# Patient Record
Sex: Female | Born: 1965 | Marital: Single | Smoking: Never smoker
Health system: Southern US, Community
[De-identification: ages and names within clinical notes are randomized; demographics above are authoritative.]

## PROBLEM LIST (undated history)

## (undated) DIAGNOSIS — F319 Bipolar disorder, unspecified: Secondary | ICD-10-CM

## (undated) DIAGNOSIS — B019 Varicella without complication: Secondary | ICD-10-CM

## (undated) DIAGNOSIS — G4730 Sleep apnea, unspecified: Secondary | ICD-10-CM

## (undated) DIAGNOSIS — D219 Benign neoplasm of connective and other soft tissue, unspecified: Secondary | ICD-10-CM

## (undated) DIAGNOSIS — N950 Postmenopausal bleeding: Secondary | ICD-10-CM

## (undated) DIAGNOSIS — K589 Irritable bowel syndrome without diarrhea: Secondary | ICD-10-CM

## (undated) HISTORY — PX: OTHER SURGICAL HISTORY: SHX169

## (undated) HISTORY — DX: Varicella without complication: B01.9

## (undated) HISTORY — DX: Irritable bowel syndrome, unspecified: K58.9

## (undated) HISTORY — DX: Bipolar disorder, unspecified: F31.9

---

## 2013-09-30 LAB — HM MAMMOGRAPHY: HM Mammogram: NORMAL

## 2014-03-06 ENCOUNTER — Other Ambulatory Visit: Payer: Self-pay | Admitting: Obstetrics and Gynecology

## 2014-03-06 ENCOUNTER — Other Ambulatory Visit: Payer: Self-pay | Admitting: Internal Medicine

## 2014-03-06 DIAGNOSIS — Z1231 Encounter for screening mammogram for malignant neoplasm of breast: Secondary | ICD-10-CM

## 2014-04-08 ENCOUNTER — Ambulatory Visit: Payer: Self-pay

## 2014-08-11 LAB — HM PAP SMEAR: HM Pap smear: NORMAL

## 2015-01-15 LAB — HM COLONOSCOPY: HM Colonoscopy: NEGATIVE

## 2015-01-29 ENCOUNTER — Other Ambulatory Visit: Payer: Self-pay | Admitting: Nurse Practitioner

## 2015-01-29 ENCOUNTER — Encounter: Payer: Self-pay | Admitting: Nurse Practitioner

## 2015-01-29 ENCOUNTER — Ambulatory Visit (INDEPENDENT_AMBULATORY_CARE_PROVIDER_SITE_OTHER): Payer: 59 | Admitting: Nurse Practitioner

## 2015-01-29 VITALS — BP 122/68 | HR 94 | Temp 99.2°F | Resp 14 | Ht 61.0 in | Wt 189.0 lb

## 2015-01-29 DIAGNOSIS — R42 Dizziness and giddiness: Secondary | ICD-10-CM | POA: Diagnosis not present

## 2015-01-29 DIAGNOSIS — Z7189 Other specified counseling: Secondary | ICD-10-CM | POA: Diagnosis not present

## 2015-01-29 DIAGNOSIS — Z7689 Persons encountering health services in other specified circumstances: Secondary | ICD-10-CM

## 2015-01-29 LAB — POCT URINALYSIS DIPSTICK
Bilirubin, UA: NEGATIVE
Glucose, UA: NEGATIVE
Ketones, UA: NEGATIVE
Nitrite, UA: NEGATIVE
Protein, UA: NEGATIVE
Spec Grav, UA: 1.015
Urobilinogen, UA: 0.2
pH, UA: 6

## 2015-01-29 MED ORDER — MECLIZINE HCL 25 MG PO TABS: 25.0000 mg | ORAL_TABLET | Freq: Three times a day (TID) | ORAL | Status: DC | PRN

## 2015-01-29 NOTE — Assessment & Plan Note (Addendum)
Discussed acute and chronic issues. Reviewed health maintenance measures, PFSHx, and immunizations. Obtain routine labs CMET and CBC w. Diff.   Flu Shot- Given today

## 2015-01-29 NOTE — Patient Instructions (Addendum)
Take the Meclizine at night and see if it helps with vertigo.   Benign Positional Vertigo Vertigo is the feeling that you or your surroundings are moving when they are not. Benign positional vertigo is the most common form of vertigo. The cause of this condition is not serious (is benign). This condition is triggered by certain movements and positions (is positional). This condition can be dangerous if it occurs while you are doing something that could endanger you or others, such as driving.  CAUSES In many cases, the cause of this condition is not known. It may be caused by a disturbance in an area of the inner ear that helps your brain to sense movement and balance. This disturbance can be caused by a viral infection (labyrinthitis), head injury, or repetitive motion. RISK FACTORS This condition is more likely to develop in:  Women.  People who are 33 years of age or older. SYMPTOMS Symptoms of this condition usually happen when you move your head or your eyes in different directions. Symptoms may start suddenly, and they usually last for less than a minute. Symptoms may include:  Loss of balance and falling.  Feeling like you are spinning or moving.  Feeling like your surroundings are spinning or moving.  Nausea and vomiting.  Blurred vision.  Dizziness.  Involuntary eye movement (nystagmus). Symptoms can be mild and cause only slight annoyance, or they can be severe and interfere with daily life. Episodes of benign positional vertigo may return (recur) over time, and they may be triggered by certain movements. Symptoms may improve over time. DIAGNOSIS This condition is usually diagnosed by medical history and a physical exam of the head, neck, and ears. You may be referred to a health care provider who specializes in ear, nose, and throat (ENT) problems (otolaryngologist) or a provider who specializes in disorders of the nervous system (neurologist). You may have additional  testing, including:  MRI.  A CT scan.  Eye movement tests. Your health care provider may ask you to change positions quickly while he or she watches you for symptoms of benign positional vertigo, such as nystagmus. Eye movement may be tested with an electronystagmogram (ENG), caloric stimulation, the Dix-Hallpike test, or the roll test.  An electroencephalogram (EEG). This records electrical activity in your brain.  Hearing tests. TREATMENT Usually, your health care provider will treat this by moving your head in specific positions to adjust your inner ear back to normal. Surgery may be needed in severe cases, but this is rare. In some cases, benign positional vertigo may resolve on its own in 2-4 weeks. HOME CARE INSTRUCTIONS Safety  Move slowly.Avoid sudden body or head movements.  Avoid driving.  Avoid operating heavy machinery.  Avoid doing any tasks that would be dangerous to you or others if a vertigo episode would occur.  If you have trouble walking or keeping your balance, try using a cane for stability. If you feel dizzy or unstable, sit down right away.  Return to your normal activities as told by your health care provider. Ask your health care provider what activities are safe for you. General Instructions  Take over-the-counter and prescription medicines only as told by your health care provider.  Avoid certain positions or movements as told by your health care provider.  Drink enough fluid to keep your urine clear or pale yellow.  Keep all follow-up visits as told by your health care provider. This is important. SEEK MEDICAL CARE IF:  You have a fever.  Your condition gets worse or you develop new symptoms.  Your family or friends notice any behavioral changes.  Your nausea or vomiting gets worse.  You have numbness or a "pins and needles" sensation. SEEK IMMEDIATE MEDICAL CARE IF:  You have difficulty speaking or moving.  You are always dizzy.  You  faint.  You develop severe headaches.  You have weakness in your legs or arms.  You have changes in your hearing or vision.  You develop a stiff neck.  You develop sensitivity to light.   This information is not intended to replace advice given to you by your health care provider. Make sure you discuss any questions you have with your health care provider.   Document Released: 11/15/2005 Document Revised: 10/29/2014 Document Reviewed: 06/02/2014 Elsevier Interactive Patient Education [employer].

## 2015-01-29 NOTE — Progress Notes (Signed)
Pre visit review using our clinic review tool, if applicable. No additional management support is needed unless otherwise documented below in the visit note. 

## 2015-01-29 NOTE — Assessment & Plan Note (Signed)
Probable vertigo in etiology. Symptoms reproduced- worse on right side from lying and then less when turning to the left. Will try Meclizine. We will follow up on the 14th.

## 2015-01-29 NOTE — Addendum Note (Signed)
Addended by: Rubbie Battiest on: 01/29/2015 03:04 PM   Modules accepted: Orders

## 2015-01-29 NOTE — Progress Notes (Signed)
Patient ID: Cassidy Santos, female    DOB: May 23, 1965  Age: 49 y.o. MRN: [id number]  CC: Nausea   HPI Cassidy Santos presents for New pt CC of nausea and dizziness x 2 days.   1) Yesterday pt reports she woke up with severe dizziness and clamminess. Room spinning- not happened to this extent before 4 hours of peristance, went to bed, Woke up and it was improved  Went to bed at 10 pm and at 11 pm and it happened again, every few hours happened until 8 am   Vomiting- small amount all night x 4  Loose bowel movements since colonoscopy 2 weeks of fatigue  No treatment to date Stress at work  [place] contacts- homeless and sick people   Takes probiotics IBS constipation    Bipolar Medications been on for years she reports  New pt info:  Tdap 2015, Wants Flu vaccine today Colonoscopy- 2016, clear this time  Eye exam 2014 PAP- 2016  LMP- 3 weeks ago  [facility]   Cyst on ovaries   Psychiatrist- Cassidy Santos in [place] [facility]    3 months  GI- Dr. Mora Santos Dr. Arnoldo Santos- Cassidy Santos     History Cassidy Santos has a past medical history of Chicken pox; IBS (irritable bowel syndrome); and Bipolar affective ([place]).   She has no past surgical history on file.   Her family history includes Heart disease in her father.She reports that she has never smoked. She has never used smokeless tobacco. She reports that she does not drink alcohol or use illicit drugs.  No outpatient prescriptions prior to visit.   No facility-administered medications prior to visit.    ROS Review of Systems  Constitutional: Positive for diaphoresis and fatigue. Negative for fever and chills.  HENT: Positive for congestion. Negative for ear discharge, ear pain, postnasal drip, rhinorrhea, sinus pressure, sneezing, sore throat, trouble swallowing and voice change.   Eyes: Negative for visual disturbance.  Respiratory: Negative for cough, chest tightness, shortness of  breath and wheezing.   Cardiovascular: Negative for chest pain, palpitations and leg swelling.  Gastrointestinal: Positive for nausea. Negative for vomiting, diarrhea and constipation.  Neurological: Positive for dizziness. Negative for syncope and headaches.  Psychiatric/Behavioral: Negative for suicidal ideas and sleep disturbance. The patient is not nervous/anxious.     Objective:  BP 122/68 mmHg  Pulse 94  Temp(Src) 99.2 F (37.3 C)  Resp 14  Ht _0  (1.549 m)  Wt 189 lb (85.73 kg)  BMI 35.73 kg/m2  SpO2 98%  LMP 01/08/2015  Physical Exam  Constitutional: She is oriented to person, place, and time. She appears well-developed and well-nourished. No distress.  HENT:  Head: Normocephalic and atraumatic.  Right Ear: External ear normal.  Left Ear: External ear normal.  Mouth/Throat: No oropharyngeal exudate.  Eyes: EOM are normal. Pupils are equal, round, and reactive to light. Right eye exhibits no discharge. Left eye exhibits no discharge. No scleral icterus.  Neck: Normal range of motion. Neck supple.  Cardiovascular: Normal rate, regular rhythm and normal heart sounds.  Exam reveals no gallop and no friction rub.   No murmur heard. Pulmonary/Chest: Effort normal and breath sounds normal. No respiratory distress. She has no wheezes. She has no rales. She exhibits no tenderness.  Abdominal: Soft. Bowel sounds are normal. She exhibits no distension and no mass. There is no tenderness. There is no rebound and no guarding.  Lymphadenopathy:    She has no cervical adenopathy.  Neurological: She  is alert and oriented to person, place, and time. No cranial nerve deficit. She exhibits normal muscle tone. Coordination normal.  Reproducible symptoms with lying down and turning onto right side/left side. Nystagmus horizontal and present only for a few seconds.  Skin: Skin is warm and dry. No rash noted. She is not diaphoretic.  Psychiatric: She has a normal mood and affect. Her behavior  is normal. Judgment and thought content normal.   Assessment & Plan:   Cassidy Santos was seen today for nausea.  Diagnoses and all orders for this visit:  Dizziness -     POCT Urinalysis Dipstick -     Comp Met (CMET) -     CBC w/Diff  Encounter to establish care  Other orders -     meclizine (ANTIVERT) 25 MG tablet; Take 1 tablet (25 mg total) by mouth 3 (three) times daily as needed for dizziness.   I am [name] start on meclizine. I am also having her maintain her lamoTRIgine, Oxcarbazepine, Norethindrone Acetate-Ethinyl Estradiol, and ARIPiprazole.  Meds ordered this encounter  Medications  . lamoTRIgine (LAMICTAL) 150 MG tablet    Sig: Take 1 tablet by mouth daily.  . Oxcarbazepine (TRILEPTAL) 300 MG tablet    Sig: Take 1 tablet by mouth 3 (three) times daily.  . Norethindrone Acetate-Ethinyl Estradiol (MICROGESTIN) 1.5-30 MG-MCG tablet    Sig: Take 1 tablet by mouth daily.  . ARIPiprazole (ABILIFY) 10 MG tablet    Sig: Take 10 mg by mouth 2 (two) times daily.  . meclizine (ANTIVERT) 25 MG tablet    Sig: Take 1 tablet (25 mg total) by mouth 3 (three) times daily as needed for dizziness.    Dispense:  90 tablet    Refill:  0    Order Specific Question:  Supervising Provider    Answer:  Cassidy Santos [[id number]]     Follow-up: Return if symptoms worsen or fail to improve, for Already scheduled for the 14th of Dec. .

## 2015-01-31 LAB — URINE CULTURE: Colony Count: >=100000

## 2015-02-04 ENCOUNTER — Ambulatory Visit: Payer: 59 | Admitting: Nurse Practitioner

## 2015-02-04 ENCOUNTER — Encounter: Payer: Self-pay | Admitting: Nurse Practitioner

## 2015-02-04 ENCOUNTER — Ambulatory Visit (INDEPENDENT_AMBULATORY_CARE_PROVIDER_SITE_OTHER): Payer: 59 | Admitting: Nurse Practitioner

## 2015-02-04 VITALS — BP 136/88 | HR 84 | Temp 98.9°F | Ht 61.0 in | Wt 193.0 lb

## 2015-02-04 DIAGNOSIS — H9193 Unspecified hearing loss, bilateral: Secondary | ICD-10-CM | POA: Diagnosis not present

## 2015-02-04 DIAGNOSIS — R42 Dizziness and giddiness: Secondary | ICD-10-CM | POA: Diagnosis not present

## 2015-02-04 NOTE — Progress Notes (Signed)
Patient ID: Cassidy Santos, female    DOB: 08/16/1965  Age: 49 y.o. MRN: [id number]  CC: Establish Care   HPI Cassidy Santos presents for establishing care and follow up on dizziness.   1) Follow up of dizziness   Feeling improved Meclizine is helpful for dizziness  2) Hearing concerns- Not hearing as well, no difference between R and L Become worse in the last few years  Hearing test entrance a few years ago without   History Cassidy Santos has a past medical history of Chicken pox; IBS (irritable bowel syndrome); and Bipolar affective ([place]).   She has no past surgical history on file.   Her family history includes Heart disease in her father.She reports that she has never smoked. She has never used smokeless tobacco. She reports that she does not drink alcohol or use illicit drugs.  Outpatient Prescriptions Prior to Visit  Medication Sig Dispense Refill  . lamoTRIgine (LAMICTAL) 150 MG tablet Take 1 tablet by mouth daily.    . meclizine (ANTIVERT) 25 MG tablet Take 1 tablet (25 mg total) by mouth 3 (three) times daily as needed for dizziness. 90 tablet 0  . Norethindrone Acetate-Ethinyl Estradiol (MICROGESTIN) 1.5-30 MG-MCG tablet Take 1 tablet by mouth daily.    . Oxcarbazepine (TRILEPTAL) 300 MG tablet Take 1 tablet by mouth 3 (three) times daily.    . ARIPiprazole (ABILIFY) 10 MG tablet Take 10 mg by mouth 2 (two) times daily.     No facility-administered medications prior to visit.    ROS Review of Systems  Constitutional: Negative for fever, chills, diaphoresis and fatigue.  HENT: Positive for hearing loss. Negative for tinnitus, trouble swallowing and voice change.   Neurological: Positive for dizziness.   Objective:  BP 136/88 mmHg  Pulse 84  Temp(Src) 98.9 F (37.2 C) (Oral)  Ht 5\' 1"  (1.549 m)  Wt 193 lb (87.544 kg)  BMI 36.49 kg/m2  LMP 01/08/2015  Physical Exam  Constitutional: She is oriented to person, place, and time. She appears well-developed and  well-nourished. No distress.  HENT:  Head: Normocephalic and atraumatic.  Right Ear: External ear normal.  Left Ear: External ear normal.  Cardiovascular: Normal rate and regular rhythm.   Pulmonary/Chest: Effort normal and breath sounds normal. No respiratory distress. She has no wheezes. She has no rales. She exhibits no tenderness.  Neurological: She is alert and oriented to person, place, and time.  Skin: Skin is warm and dry. No rash noted. She is not diaphoretic.  Psychiatric: She has a normal mood and affect. Her behavior is normal. Judgment and thought content normal.  Flat affect   Assessment & Plan:   Cassidy Santos was seen today for establish care.  Diagnoses and all orders for this visit:  Dizziness  Hearing loss, bilateral  I am [name] maintain her lamoTRIgine, Oxcarbazepine, Norethindrone Acetate-Ethinyl Estradiol, and meclizine.  No orders of the defined types were placed in this encounter.     Follow-up: Return if symptoms worsen or fail to improve.

## 2015-02-04 NOTE — Patient Instructions (Addendum)
August for your complete physical.   Happy Holidays!

## 2015-02-10 ENCOUNTER — Other Ambulatory Visit: Payer: Self-pay | Admitting: Nurse Practitioner

## 2015-02-10 ENCOUNTER — Telehealth: Payer: Self-pay | Admitting: Nurse Practitioner

## 2015-02-10 DIAGNOSIS — H9193 Unspecified hearing loss, bilateral: Secondary | ICD-10-CM

## 2015-02-10 DIAGNOSIS — H8113 Benign paroxysmal vertigo, bilateral: Secondary | ICD-10-CM

## 2015-02-10 MED ORDER — ARIPIPRAZOLE 10 MG PO TABS: 10.0000 mg | ORAL_TABLET | Freq: Every day | ORAL | Status: DC

## 2015-02-10 NOTE — Telephone Encounter (Signed)
Please advise 

## 2015-02-10 NOTE — Telephone Encounter (Signed)
Patient needs a refill on this medication which is a historical medication. Please advise?

## 2015-02-10 NOTE — Telephone Encounter (Signed)
NP has approved and it was sent to the pharmacy.

## 2015-02-10 NOTE — Telephone Encounter (Signed)
Sent to provider for review

## 2015-02-10 NOTE — Telephone Encounter (Signed)
Pt came into office today. She would like to know if [facility] can refill her ARIPiprazole (ABILIFY) 10 MG tablet a 30 day supply. Please send refill to [facility] on [address].  Thank you.

## 2015-02-13 DIAGNOSIS — H9190 Unspecified hearing loss, unspecified ear: Secondary | ICD-10-CM | POA: Insufficient documentation

## 2015-02-13 NOTE — Assessment & Plan Note (Addendum)
Improved with meclizine. Pt would still like to see ENT for hearing loss bilaterally and to see if she has inner ear concerns.

## 2015-02-13 NOTE — Assessment & Plan Note (Signed)
ENT referral in place for hearing concerns and vertigo. FU prn worsening/failure to improve.

## 2015-03-24 ENCOUNTER — Encounter: Payer: Self-pay | Admitting: Nurse Practitioner

## 2015-07-02 ENCOUNTER — Ambulatory Visit: Payer: 59 | Admitting: Nurse Practitioner

## 2015-07-02 ENCOUNTER — Ambulatory Visit (INDEPENDENT_AMBULATORY_CARE_PROVIDER_SITE_OTHER): Payer: 59 | Admitting: Nurse Practitioner

## 2015-07-02 ENCOUNTER — Encounter: Payer: Self-pay | Admitting: Nurse Practitioner

## 2015-07-02 VITALS — BP 112/70 | HR 93 | Temp 98.6°F | Resp 16 | Ht 61.0 in | Wt 189.0 lb

## 2015-07-02 DIAGNOSIS — M25532 Pain in left wrist: Secondary | ICD-10-CM | POA: Diagnosis not present

## 2015-07-02 NOTE — Patient Instructions (Signed)
Advil, ice, and rest Immobilizer at night Continue tumeric.   It was great working with you! Take care!

## 2015-07-02 NOTE — Assessment & Plan Note (Signed)
New onset  Conservative measures chosen by pt Tumeric, advil prn, ice, rest, immobilizer FU prn worsening/failure to improve.

## 2015-07-02 NOTE — Progress Notes (Signed)
Patient ID: Cassidy Santos, female    DOB: October 11, 1965  Age: 50 y.o. MRN: [id number]  CC: Hand Pain   HPI Cassidy Santos presents for CC of left wrist pain x 4 weeks.  Onset- 4 weeks  Location- left CMC joint of thumb Duration - constant  Characteristics- Locking up at night, sore during the day  Aggravating factors- use of phone/computer Relieving factors- rest Severity- mild- improving  Slightly improving  Trauma- denies  Treatment to date:  Immobilizer  Massage Ibuprofen- helps   History Cassidy Santos has a past medical history of Chicken pox; IBS (irritable bowel syndrome); and Bipolar affective ([place]).   She has no past surgical history on file.   Her family history includes Heart disease in her father.She reports that she has never smoked. She has never used smokeless tobacco. She reports that she does not drink alcohol or use illicit drugs.  Outpatient Prescriptions Prior to Visit  Medication Sig Dispense Refill  . ARIPiprazole (ABILIFY) 10 MG tablet Take 1 tablet (10 mg total) by mouth daily. (Patient taking differently: Take 2 mg by mouth daily. ) 30 tablet 0  . lamoTRIgine (LAMICTAL) 150 MG tablet Take 1 tablet by mouth daily.    . meclizine (ANTIVERT) 25 MG tablet Take 1 tablet (25 mg total) by mouth 3 (three) times daily as needed for dizziness. 90 tablet 0  . Norethindrone Acetate-Ethinyl Estradiol (MICROGESTIN) 1.5-30 MG-MCG tablet Take 1 tablet by mouth daily.    . Oxcarbazepine (TRILEPTAL) 300 MG tablet Take 1 tablet by mouth 3 (three) times daily. Reported on 07/02/2015     No facility-administered medications prior to visit.    ROS Review of Systems  Constitutional: Negative for fever, chills, diaphoresis and fatigue.  Respiratory: Negative for chest tightness, shortness of breath and wheezing.   Cardiovascular: Negative for chest pain, palpitations and leg swelling.  Gastrointestinal: Negative for nausea, vomiting and diarrhea.  Musculoskeletal:  Positive for joint swelling and arthralgias. Negative for myalgias, back pain, gait problem, neck pain and neck stiffness.  Skin: Negative for rash.  Neurological: Negative for dizziness, weakness, numbness and headaches.  Psychiatric/Behavioral: The patient is not nervous/anxious.     Objective:  BP 112/70 mmHg  Pulse 93  Temp(Src) 98.6 F (37 C) (Oral)  Resp 16  Ht 5\' 1"  (1.549 m)  Wt 189 lb (85.73 kg)  BMI 35.73 kg/m2  SpO2 98%  LMP 06/22/2015  Physical Exam  Constitutional: She is oriented to person, place, and time. She appears well-developed and well-nourished. No distress.  HENT:  Head: Normocephalic and atraumatic.  Right Ear: External ear normal.  Left Ear: External ear normal.  Cardiovascular: Normal rate, regular rhythm and normal heart sounds.   Pulmonary/Chest: Effort normal and breath sounds normal. No respiratory distress. She has no wheezes. She has no rales. She exhibits no tenderness.  Musculoskeletal: Normal range of motion. She exhibits tenderness. She exhibits no edema.       Hands: CMC joint tenderness to ROM against resistance, none to palpation, finkelstein was negative of left side.     Neurological: She is alert and oriented to person, place, and time. No cranial nerve deficit. She exhibits normal muscle tone. Coordination normal.  Skin: Skin is warm and dry. No rash noted. She is not diaphoretic.  Psychiatric: She has a normal mood and affect. Her behavior is normal. Judgment and thought content normal.   Assessment & Plan:   Cassidy Santos was seen today for hand pain.  Diagnoses and all orders for this visit:  Left wrist pain   I am [name] maintain her lamoTRIgine, Oxcarbazepine, Norethindrone Acetate-Ethinyl Estradiol, meclizine, and ARIPiprazole.  No orders of the defined types were placed in this encounter.     Follow-up: Return if symptoms worsen or fail to improve.

## 2016-05-11 ENCOUNTER — Other Ambulatory Visit: Payer: Self-pay | Admitting: Obstetrics and Gynecology

## 2016-05-11 DIAGNOSIS — Z01419 Encounter for gynecological examination (general) (routine) without abnormal findings: Secondary | ICD-10-CM

## 2016-08-05 DIAGNOSIS — F3178 Bipolar disorder, in full remission, most recent episode mixed: Secondary | ICD-10-CM | POA: Insufficient documentation

## 2016-08-08 DIAGNOSIS — R7303 Prediabetes: Secondary | ICD-10-CM | POA: Insufficient documentation

## 2016-10-31 ENCOUNTER — Other Ambulatory Visit: Payer: Self-pay | Admitting: Obstetrics and Gynecology

## 2016-10-31 ENCOUNTER — Ambulatory Visit
Admission: RE | Admit: 2016-10-31 | Discharge: 2016-10-31 | Disposition: A | Discharge status: Home / Self Care | Payer: BC Managed Care – PPO | Source: Ambulatory Visit | Attending: Obstetrics and Gynecology | Admitting: Obstetrics and Gynecology

## 2016-10-31 DIAGNOSIS — Z01419 Encounter for gynecological examination (general) (routine) without abnormal findings: Secondary | ICD-10-CM

## 2016-10-31 DIAGNOSIS — Z1231 Encounter for screening mammogram for malignant neoplasm of breast: Secondary | ICD-10-CM | POA: Diagnosis not present

## 2016-11-08 ENCOUNTER — Inpatient Hospital Stay
Admission: RE | Admit: 2016-11-08 | Discharge: 2016-11-08 | Disposition: A | Discharge status: Home / Self Care | Payer: Self-pay | Source: Ambulatory Visit | Attending: *Deleted | Admitting: *Deleted

## 2016-11-08 ENCOUNTER — Other Ambulatory Visit: Payer: Self-pay | Admitting: *Deleted

## 2016-11-08 DIAGNOSIS — Z9289 Personal history of other medical treatment: Secondary | ICD-10-CM

## 2018-02-06 ENCOUNTER — Other Ambulatory Visit: Payer: Self-pay | Admitting: Obstetrics and Gynecology

## 2018-02-06 DIAGNOSIS — Z1231 Encounter for screening mammogram for malignant neoplasm of breast: Secondary | ICD-10-CM

## 2018-02-22 ENCOUNTER — Encounter: Payer: Self-pay | Admitting: Radiology

## 2018-02-22 ENCOUNTER — Ambulatory Visit
Admission: RE | Admit: 2018-02-22 | Discharge: 2018-02-22 | Disposition: A | Discharge status: Home / Self Care | Payer: BLUE CROSS/BLUE SHIELD | Source: Ambulatory Visit | Attending: Obstetrics and Gynecology | Admitting: Obstetrics and Gynecology

## 2018-02-22 DIAGNOSIS — Z1231 Encounter for screening mammogram for malignant neoplasm of breast: Secondary | ICD-10-CM | POA: Diagnosis present

## 2018-03-22 DIAGNOSIS — K581 Irritable bowel syndrome with constipation: Secondary | ICD-10-CM | POA: Insufficient documentation

## 2018-11-27 DIAGNOSIS — F319 Bipolar disorder, unspecified: Secondary | ICD-10-CM | POA: Insufficient documentation

## 2019-01-01 ENCOUNTER — Other Ambulatory Visit: Payer: Self-pay | Admitting: Family Medicine

## 2019-01-01 DIAGNOSIS — Z1231 Encounter for screening mammogram for malignant neoplasm of breast: Secondary | ICD-10-CM

## 2019-01-23 IMAGING — MG DIGITAL SCREENING BILATERAL MAMMOGRAM WITH TOMO AND CAD
8 series · 8 of 24 positions shown · non-contrast
Comparison: Previous exam(s).

CLINICAL DATA: Screening.

EXAM:
DIGITAL SCREENING BILATERAL MAMMOGRAM WITH TOMO AND CAD

[L MLO synth-2D]
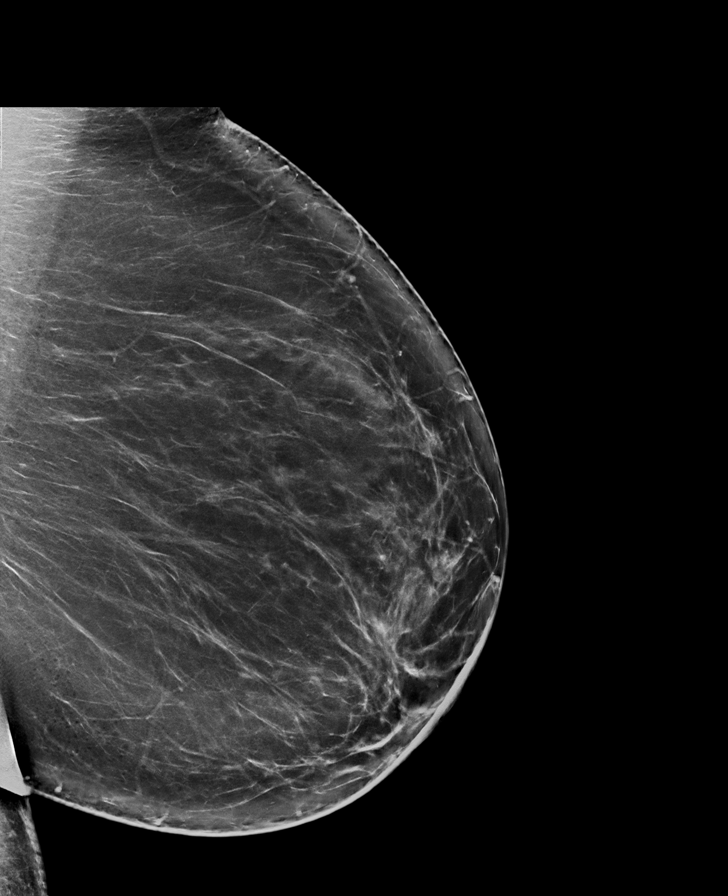

[R CC synth-2D]
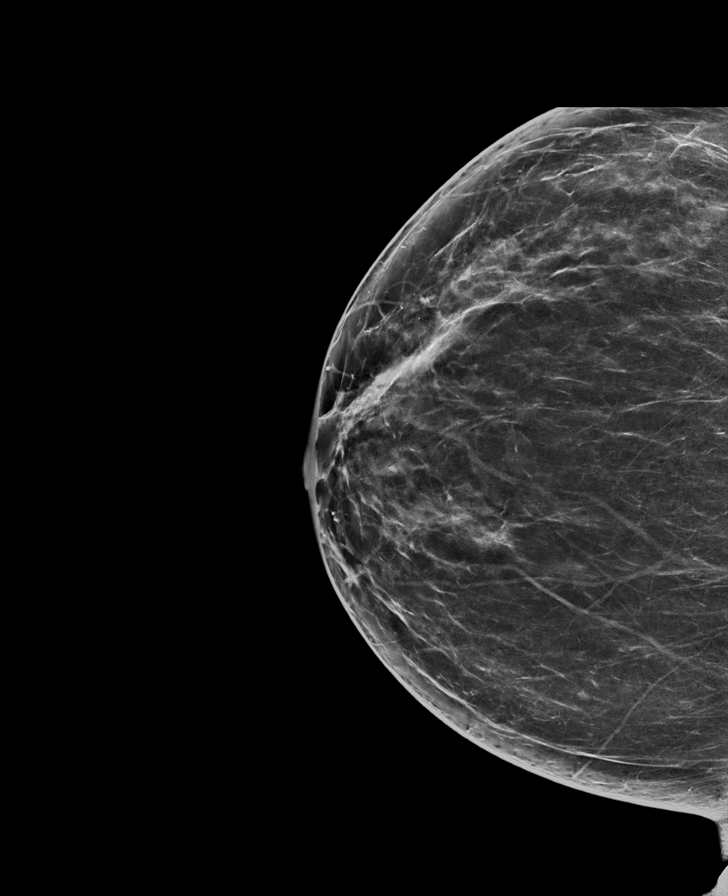

[R MLO synth-2D]
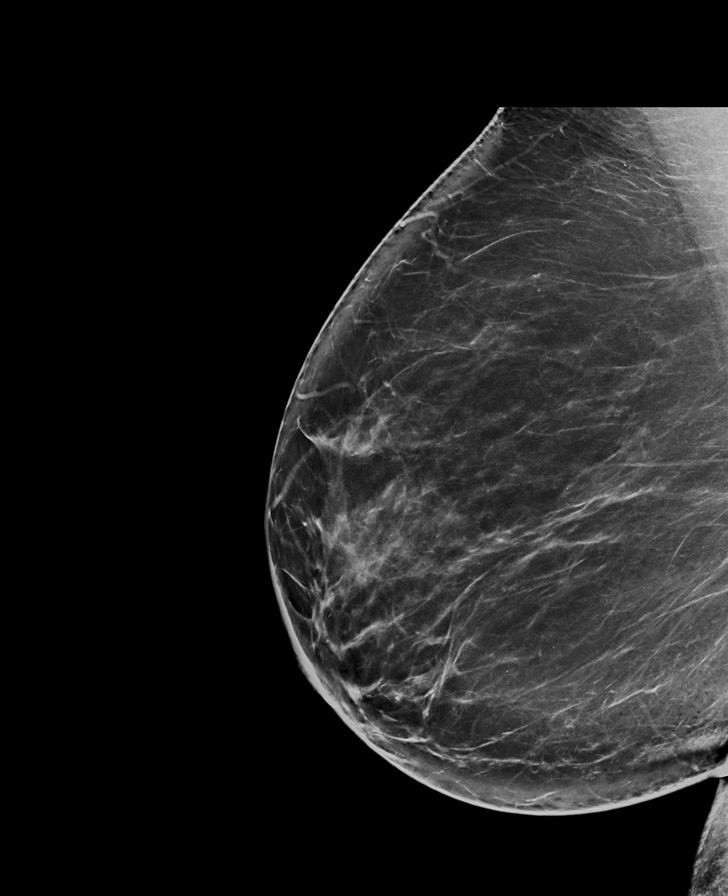

[L CC synth-2D]
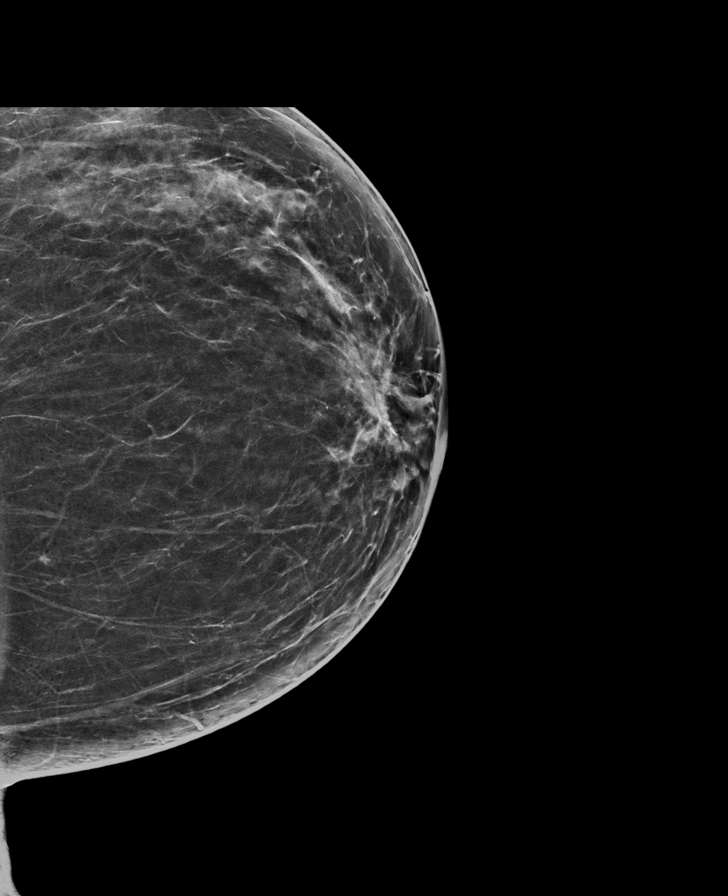

[R MLO tomo · tomo slice 47/93.0]
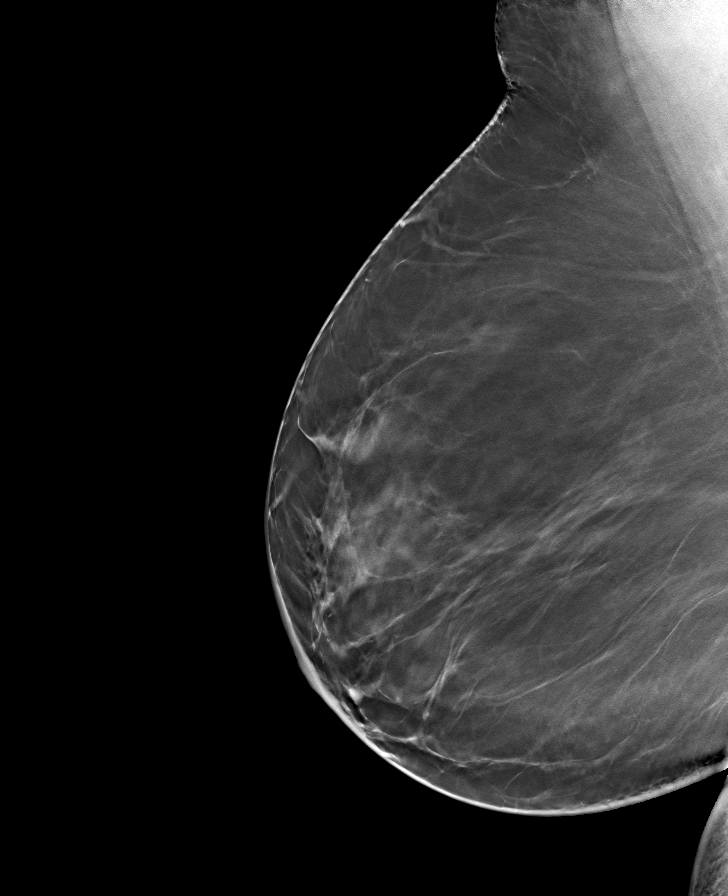

[L CC tomo · tomo slice 41/82.0]
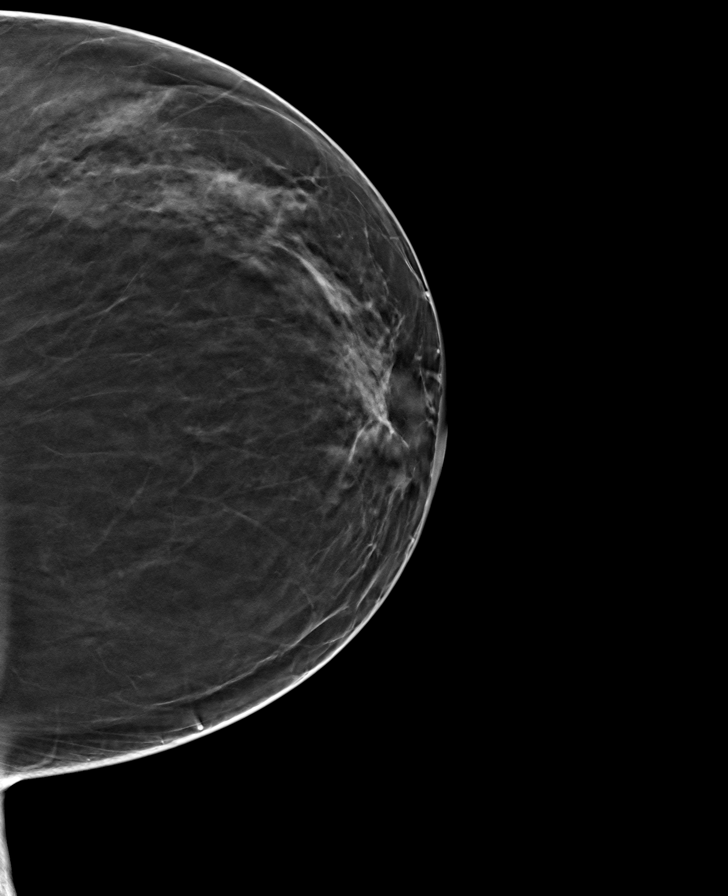

[L MLO tomo · tomo slice 49/98.0]
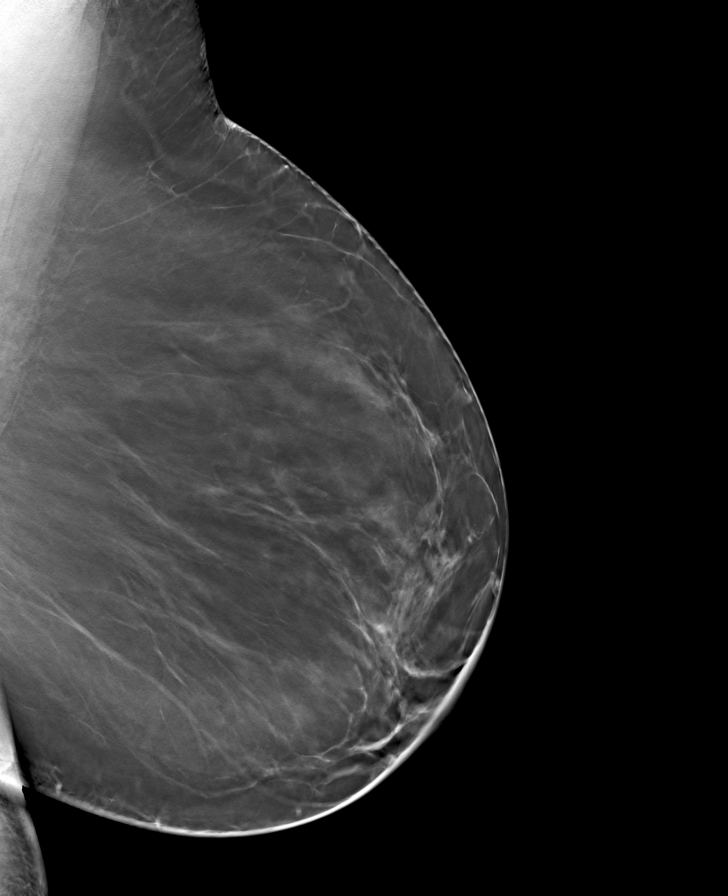

[R CC tomo · tomo slice 39/77.0]
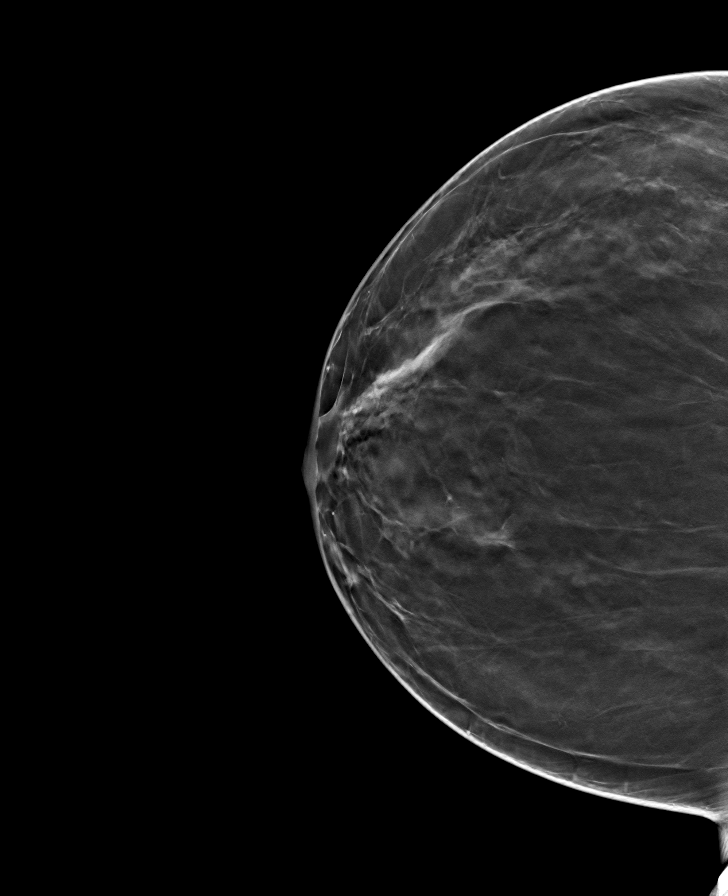

[8 of 24 positions shown; findings below may reference images not displayed]

ACR Breast Density Category b: There are scattered areas of
fibroglandular density.
FINDINGS: There are no findings suspicious for malignancy. Images were
processed with CAD.
IMPRESSION: No mammographic evidence of malignancy. A result letter of this
screening mammogram will be mailed directly to the patient.

RECOMMENDATION:
Screening mammogram in one year. (Code:[id number])

BI-RADS CATEGORY  1: Negative.

## 2019-06-17 ENCOUNTER — Ambulatory Visit
Admission: RE | Admit: 2019-06-17 | Discharge: 2019-06-17 | Disposition: A | Discharge status: Home / Self Care | Payer: BC Managed Care – PPO | Source: Ambulatory Visit | Attending: Family Medicine | Admitting: Family Medicine

## 2019-06-17 ENCOUNTER — Other Ambulatory Visit: Payer: Self-pay

## 2019-06-17 DIAGNOSIS — Z1231 Encounter for screening mammogram for malignant neoplasm of breast: Secondary | ICD-10-CM | POA: Diagnosis not present

## 2020-05-22 NOTE — Progress Notes (Signed)
[facility] [place], [place] [address]  Pulmonary Sleep Medicine   Office Visit Note  Patient Name: Cassidy Santos DOB: 1965/12/07 MRN [id number]    Chief Complaint: Obstructive Sleep Apnea visit  Brief History:  Misao is seen today for initial consultation. The patient has a 4 year history of sleep apnea.She recently received a replacement CPAP. Her old machine was on the recall.  Patient is using PAP nightly.  The patient feels somewhat more rested. after sleeping with PAP.  The patient reports benefiting from PAP use although she is still adjusting to the increase in pressure. Reported sleepiness is  Not much better due to limited use and the Epworth Sleepiness Score is 13 out of 24. The patient does not take naps but is sleepy when she drives. The patient complains of the following: throat pain.  The compliance download shows fair compliance with an average use time of 5.4 hours. The AHI is 2  The patient does not complain of limb movements disrupting sleep.  ROS  General: (-) fever, (-) chills, (-) night sweat Nose and Sinuses: (-) nasal stuffiness or itchiness, (-) postnasal drip, (-) nosebleeds, (-) sinus trouble. Mouth and Throat: (-) sore throat, (-) hoarseness. Neck: (-) swollen glands, (-) enlarged thyroid, (-) neck pain. Respiratory: - cough, - shortness of breath, - wheezing. Neurologic: - numbness, - tingling. Psychiatric: - anxiety, - depression   Current Medication: Outpatient Encounter Medications as of 05/25/2020  Medication Sig Note  . lamoTRIgine (LAMICTAL) 150 MG tablet lamotrigine 150 mg tablet   . meclizine (ANTIVERT) 12.5 MG tablet Take by mouth.   . ARIPiprazole (ABILIFY) 10 MG tablet Take 1 tablet (10 mg total) by mouth daily. (Patient taking differently: Take 2 mg by mouth daily. )   . ergocalciferol (VITAMIN D2) 1.25 MG (50000 UT) capsule Vitamin D2 1,250 mcg (50,000 unit) capsule   . Fish Oil-Vitamin D 1000-1000 MG-UNIT  CAPS Take by mouth.   . lamoTRIgine (LAMICTAL) 150 MG tablet Take 1 tablet by mouth daily. 01/29/2015: Received from: [place]:   . lubiprostone (AMITIZA) 8 MCG capsule Amitiza 8 mcg capsule   . meclizine (ANTIVERT) 25 MG tablet Take 1 tablet (25 mg total) by mouth 3 (three) times daily as needed for dizziness.   . Multiple Vitamin (MULTI-VITAMINS) TABS Take by mouth.   . [DISCONTINUED] Norethindrone Acetate-Ethinyl Estradiol (MICROGESTIN) 1.5-30 MG-MCG tablet Take 1 tablet by mouth daily. 01/29/2015: Received from: [place]:   . [DISCONTINUED] Oxcarbazepine (TRILEPTAL) 300 MG tablet Take 1 tablet by mouth 3 (three) times daily. Reported on 07/02/2015 01/29/2015: Received from: [place]:    No facility-administered encounter medications on file as of 05/25/2020.    Surgical History: History reviewed. No pertinent surgical history.  Medical History: Past Medical History:  Diagnosis Date  . Bipolar affective ([place])   . Chicken pox   . IBS (irritable bowel syndrome)     Family History: Non contributory to the present illness  Social History: Social History   Socioeconomic History  . Marital status: Single    Spouse name: Not on file  . Number of children: Not on file  . Years of education: Not on file  . Highest education level: Not on file  Occupational History  . Not on file  Tobacco Use  . Smoking status: Never Smoker  . Smokeless tobacco: Never Used  Substance and Sexual Activity  . Alcohol use: No    Alcohol/week: 0.0 standard drinks  .  Drug use: No  . Sexual activity: Yes    Partners: Male    Birth control/protection: None    Comment: 1 Partner  Other Topics Concern  . Not on file  Social History Narrative   Pt is single   Employed as a Licensed conveyancer    Has a JD degree    Social Determinants of Radio broadcast assistant Strain: Not on file  Food Insecurity: Not on file  Transportation Needs: Not on file   Physical Activity: Not on file  Stress: Not on file  Social Connections: Not on file  Intimate Partner Violence: Not on file    Vital Signs: Blood pressure 139/86, pulse 97, temperature 98.6 F (37 C), temperature source Temporal, resp. rate 14, height 4\' 11"  (1.499 m), weight 203 lb (92.1 kg), last menstrual period 10/17/2016, SpO2 97 %.  Examination: General Appearance: The patient is well-developed, well-nourished, and in no distress. Neck Circumference: 37 Skin: Gross inspection of skin unremarkable. Head: normocephalic, no gross deformities. Eyes: no gross deformities noted. ENT: ears appear grossly normal Neurologic: Alert and oriented. No involuntary movements.    EPWORTH SLEEPINESS SCALE:  Scale:  (0)= no chance of dozing; (1)= slight chance of dozing; (2)= moderate chance of dozing; (3)= high chance of dozing  Chance  Situtation    Sitting and reading: 2    Watching TV: 1    Sitting Inactive in public: 2    As a passenger in car: 2      Lying down to rest: 3    Sitting and talking: 1    Sitting quielty after lunch: 2    In a car, stopped in traffic: 0   TOTAL SCORE:   13 out of 24    SLEEP STUDIES:  1. Split 05/24/16 - AHI 63.9,  REM AHI 91.7,   Low SpO2  73%,  APAP 6-12 cmH2O   CPAP COMPLIANCE DATA:  Date Range: 04/19/20 - 05/18/20  Average Daily Use: 5:22 hours  Median Use: 6 hours  Compliance for > 4 Hours: 77%  AHI: 2.0 respiratory events per hour  Days Used: 29/30 days  Mask Leak: 7.2 lpm  95th Percentile Pressure: 17/12 cmH2O         LABS: No results found for this or any previous visit (from the past 2160 hour(s)).  Radiology: MM 3D SCREEN BREAST BILATERAL  Result Date: 06/18/2019 CLINICAL DATA:  Screening. EXAM: DIGITAL SCREENING BILATERAL MAMMOGRAM WITH TOMO AND CAD COMPARISON:  Previous exam(s). ACR Breast Density Category b: There are scattered areas of fibroglandular density. FINDINGS: There are no findings  suspicious for malignancy. Images were processed with CAD. IMPRESSION: No mammographic evidence of malignancy. A result letter of this screening mammogram will be mailed directly to the patient. RECOMMENDATION: Screening mammogram in one year. (Code:SM-B-01Y) BI-RADS CATEGORY  1: Negative. Electronically Signed   By: Curlene Dolphin M.D.   On: 06/18/2019 12:49    No results found.  No results found.    Assessment and Plan: Patient Active Problem List   Diagnosis Date Noted  . OSA on CPAP 05/25/2020  . CPAP use counseling 05/25/2020  . Morbid obesity ([place]) 05/25/2020  . BMI 40.0-44.9, adult ([place]) 05/25/2020  . Left wrist pain 07/02/2015  . Hearing loss 02/13/2015  . Encounter to establish care 01/29/2015  . Dizziness 01/29/2015   1. OSA on CPAP The patient does tolerate PAP and reports less benefit from PAP use.She is not doing well at this time due to burning pain  in her upper airway. She was  The patient was reminded how to clean the CPAP and advised to increase the humidifier setting and she may need heated tubing.. The patient was also counselled on the importance of weight loss. The compliance is fair. The apnea is controlled. OSA- use CPAP all night, every night.    2. CPAP use counseling CPAP Counseling: had a lengthy discussion with the patient regarding the importance of PAP therapy in management of the sleep apnea. Patient appears to understand the risk factor reduction and also understands the risks associated with untreated sleep apnea. Patient will try to make a good faith effort to remain compliant with therapy. Also instructed the patient on proper cleaning of the device including the water must be changed daily if possible and use of distilled water is preferred. Patient understands that the machine should be regularly cleaned with appropriate recommended cleaning solutions that do not damage the PAP machine for example given white vinegar and water rinses. Other methods such  as ozone treatment may not be as good as these simple methods to achieve cleaning.  3. Morbid obesity ([place]) Obesity Counseling: Had a lengthy discussion regarding patients BMI and weight issues. Patient was instructed on portion control as well as increased activity. Also discussed caloric restrictions with trying to maintain intake less than 2000 Kcal. Discussions were made in accordance with the 5As of weight management. Simple actions such as not eating late and if able to, taking a walk is suggested.  4. BMI 40.0-44.9, adult ([place]) Encouraged to set a weight goal, increase vegetable intake.    General Counseling: I have discussed the findings of the evaluation and examination with Zayah.  I have also discussed any further diagnostic evaluation thatmay be needed or ordered today. Ardith verbalizes understanding of the findings of todays visit. We also reviewed her medications today and discussed drug interactions and side effects including but not limited excessive drowsiness and altered mental states. We also discussed that there is always a risk not just to her but also people around her. she has been encouraged to call the office with any questions or concerns that should arise related to todays visit.  No orders of the defined types were placed in this encounter.       I have personally obtained a history, examined the patient, evaluated laboratory and imaging results, formulated the assessment and plan and placed orders.  This patient was seen today by Tressie Ellis, PA-C in collaboration with Dr. Devona Konig.  Richelle Ito Saunders Glance, PhD, FAASM  Diplomate, American Board of Sleep Medicine    Allyne Gee, MD [facility] Diplomate ABMS Pulmonary and Critical Care Medicine Sleep medicine

## 2020-05-25 ENCOUNTER — Ambulatory Visit (INDEPENDENT_AMBULATORY_CARE_PROVIDER_SITE_OTHER): Payer: BC Managed Care – PPO | Admitting: Internal Medicine

## 2020-05-25 VITALS — BP 139/86 | HR 97 | Temp 98.6°F | Resp 14 | Ht 59.0 in | Wt 203.0 lb

## 2020-05-25 DIAGNOSIS — Z6841 Body Mass Index (BMI) 40.0 and over, adult: Secondary | ICD-10-CM

## 2020-05-25 DIAGNOSIS — G4733 Obstructive sleep apnea (adult) (pediatric): Secondary | ICD-10-CM

## 2020-05-25 DIAGNOSIS — Z9989 Dependence on other enabling machines and devices: Secondary | ICD-10-CM

## 2020-05-25 DIAGNOSIS — Z7189 Other specified counseling: Secondary | ICD-10-CM

## 2020-05-25 NOTE — Patient Instructions (Signed)

## 2020-06-26 ENCOUNTER — Other Ambulatory Visit: Payer: Self-pay

## 2020-06-26 ENCOUNTER — Ambulatory Visit
Admission: EM | Admit: 2020-06-26 | Discharge: 2020-06-26 | Disposition: A | Discharge status: Home / Self Care | Payer: BC Managed Care – PPO | Attending: Family Medicine | Admitting: Family Medicine

## 2020-06-26 DIAGNOSIS — S91331A Puncture wound without foreign body, right foot, initial encounter: Secondary | ICD-10-CM | POA: Diagnosis not present

## 2020-06-26 DIAGNOSIS — Z23 Encounter for immunization: Secondary | ICD-10-CM

## 2020-06-26 MED ORDER — TETANUS-DIPHTH-ACELL PERTUSSIS 5-2.5-18.5 LF-MCG/0.5 IM SUSY
0.5000 mL | PREFILLED_SYRINGE | Freq: Once | INTRAMUSCULAR | Status: AC
  Administered 2020-06-26: 0.5 mL via INTRAMUSCULAR

## 2020-06-26 NOTE — ED Triage Notes (Addendum)
Pt c/o stepping on a metal tab with her right foot. Pt reports a small puncture to the bottom of her foot. Pt reports last tetanus in 2015. Pt states she did clean the area and applied neosporin. She also took some Tylenol.

## 2020-06-26 NOTE — ED Provider Notes (Signed)
[facility]    CSN: [id number] Arrival date & time: 06/26/20  0842      History   Chief Complaint Chief Complaint  Patient presents with  . Foot Injury    right   HPI  55 year old female presents with a puncture wound to her right foot.  Patient stepped on a metal tab this morning.  She suffered a small puncture wound on the plantar aspect of her right foot.  She has cleaned the area.  She has applied topical antibiotic ointment.  She was not wearing shoes.  She has taken some Tylenol as well.  Last tetanus was in 2015.  She desires tetanus today.  Denies pain at this time.  No other associated symptoms.  No other complaints.  Past Medical History:  Diagnosis Date  . Bipolar affective ([place])   . Chicken pox   . IBS (irritable bowel syndrome)     Patient Active Problem List   Diagnosis Date Noted  . OSA on CPAP 05/25/2020  . CPAP use counseling 05/25/2020  . Morbid obesity ([place]) 05/25/2020  . BMI 40.0-44.9, adult ([place]) 05/25/2020  . Left wrist pain 07/02/2015  . Hearing loss 02/13/2015  . Encounter to establish care 01/29/2015  . Dizziness 01/29/2015    History reviewed. No pertinent surgical history.  OB History   No obstetric history on file.      Home Medications    Prior to Admission medications   Medication Sig Start Date End Date Taking? Authorizing Provider  Fish Oil-Vitamin D 1000-1000 MG-UNIT CAPS Take by mouth.   Yes [provider]  lamoTRIgine (LAMICTAL) 150 MG tablet Take 1 tablet by mouth daily. 08/28/14  Yes [provider]  meclizine (ANTIVERT) 12.5 MG tablet Take by mouth. 11/16/16  Yes [provider]  Multiple Vitamin (MULTI-VITAMINS) TABS Take by mouth.   Yes [provider]  B Complex Vitamins (VITAMIN-B COMPLEX PO) Take by mouth.    [provider]  ergocalciferol (VITAMIN D2) 1.25 MG (50000 UT) capsule Vitamin D2 1,250 mcg (50,000 unit) capsule    [provider]  TURMERIC PO  Take by mouth.    [provider]  ARIPiprazole (ABILIFY) 10 MG tablet Take 1 tablet (10 mg total) by mouth daily. Patient taking differently: Take 2 mg by mouth daily. 02/10/15 06/26/20  Rubbie Battiest, RN    Family History Family History  Problem Relation Age of Onset  . Heart disease Father   . Breast cancer Maternal Aunt 62  . Breast cancer Cousin        paternal    Social History Social History   Tobacco Use  . Smoking status: Never Smoker  . Smokeless tobacco: Never Used  Vaping Use  . Vaping Use: Never used  Substance Use Topics  . Alcohol use: No    Alcohol/week: 0.0 standard drinks  . Drug use: No     Allergies   No known allergies   Review of Systems Review of Systems Per HPI  Physical Exam Triage Vital Signs ED Triage Vitals  Enc Vitals Group     BP 06/26/20 0901 119/77     Pulse Rate 06/26/20 0901 87     Resp 06/26/20 0901 18     Temp 06/26/20 0901 98.6 F (37 C)     Temp Source 06/26/20 0901 Oral     SpO2 06/26/20 0901 97 %     Weight 06/26/20 0857 205 lb (93 kg)     Height 06/26/20  0857 4\' 11"  (1.499 m)     Head Circumference --      Peak Flow --      Pain Score 06/26/20 0857 0     Pain Loc --      Pain Edu? --      Excl. in [place]? --    Updated Vital Signs BP 119/77 (BP Location: Left Arm)   Pulse 87   Temp 98.6 F (37 C) (Oral)   Resp 18   Ht 4\' 11"  (1.499 m)   Wt 93 kg   LMP 10/17/2016   SpO2 97%   BMI 41.40 kg/m   Visual Acuity Right Eye Distance:   Left Eye Distance:   Bilateral Distance:    Right Eye Near:   Left Eye Near:    Bilateral Near:     Physical Exam Constitutional:      General: She is not in acute distress.    Appearance: Normal appearance. She is obese. She is not ill-appearing.  HENT:     Head: Normocephalic and atraumatic.  Eyes:     General:        Right eye: No discharge.        Left eye: No discharge.     Conjunctiva/sclera: Conjunctivae normal.  Pulmonary:     Effort: Pulmonary effort  is normal. No respiratory distress.  Skin:    Comments: Right foot -small hyperpigmented area noted.  No surrounding redness.  No drainage.  Neurological:     Mental Status: She is alert.  Psychiatric:        Mood and Affect: Mood normal.        Behavior: Behavior normal.    UC Treatments / Results  Labs (all labs ordered are listed, but only abnormal results are displayed) Labs Reviewed - No data to display  EKG   Radiology No results found.  Procedures Procedures (including critical care time)  Medications Ordered in UC Medications  Tdap (BOOSTRIX) injection 0.5 mL (0.5 mLs Intramuscular Given 06/26/20 0916)    Initial Impression / Assessment and Plan / UC Course  I have reviewed the triage vital signs and the nursing notes.  Pertinent labs & imaging results that were available during my care of the patient were reviewed by me and considered in my medical decision making (see chart for details).    55 year old female presents with a puncture wound.  No erythema or drainage.  I feel that she is at low risk for infection.  Will not proceed with prophylaxis.  Advised to keep a close eye.  Tetanus given today.  Final Clinical Impressions(s) / UC Diagnoses   Final diagnoses:  Puncture wound of plantar aspect of right foot, initial encounter     Discharge Instructions     Keep clean.  Keep a close eye.  If you develop redness or drainage, please let us know.  Take care  Dr. Lacinda Axon    ED Prescriptions    None     PDMP not reviewed this encounter.   Thersa Salt [place], [place] 06/26/20 [phone]

## 2020-06-26 NOTE — Discharge Instructions (Addendum)
Keep clean.  Keep a close eye.  If you develop redness or drainage, please let us know.  Take care  Dr. Lacinda Axon

## 2021-01-26 DIAGNOSIS — D259 Leiomyoma of uterus, unspecified: Secondary | ICD-10-CM | POA: Insufficient documentation

## 2021-02-21 HISTORY — PX: COLONOSCOPY: SHX174

## 2021-04-05 ENCOUNTER — Ambulatory Visit (INDEPENDENT_AMBULATORY_CARE_PROVIDER_SITE_OTHER): Payer: BC Managed Care – PPO | Admitting: Internal Medicine

## 2021-04-05 VITALS — BP 105/69 | HR 80 | Resp 16 | Ht 59.0 in | Wt 207.0 lb

## 2021-04-05 DIAGNOSIS — Z7189 Other specified counseling: Secondary | ICD-10-CM

## 2021-04-05 DIAGNOSIS — Z9989 Dependence on other enabling machines and devices: Secondary | ICD-10-CM | POA: Diagnosis not present

## 2021-04-05 DIAGNOSIS — G4733 Obstructive sleep apnea (adult) (pediatric): Secondary | ICD-10-CM | POA: Diagnosis not present

## 2021-04-05 DIAGNOSIS — Z6834 Body mass index (BMI) 34.0-34.9, adult: Secondary | ICD-10-CM | POA: Diagnosis not present

## 2021-04-05 NOTE — Patient Instructions (Signed)

## 2021-04-05 NOTE — Progress Notes (Signed)
[place], [place] [address]  Pulmonary Sleep Medicine   Office Visit Note  Patient Name: Cassidy Santos DOB: 23-Nov-1965 MRN [id number]    Chief Complaint: Obstructive Sleep Apnea visit  Brief History:  Cassidy Santos is seen today for follow up appointment. The patient has a 5 year history of sleep apnea. Patient is mostly using PAP nightly, but frequently noncompliant.  Patient admits she knows she needs to use it more. Normally goes to bed 1130 pm  - midnight, wakes  4 - 5am to take dog out, and goes back to sleep approx 9 am. 3-4 nights, can't get back to sleep The patient feels  no difference when she uses or doesn't use after sleeping with PAP.  The patient reports benefits some, but not enough from PAP use. Epworth Sleepiness Score is 15 out of 24. The patient does take naps and does not use CPAP.The patient complains of the following: cold  nose & burning and a little tired all the time; has machine cover and no humidifier attached anymore.    The compliance download shows  compliance with an average use time of 4:18 hours@60 %. The AHI is 1.3  The patient does not complain of limb movements disrupting sleep.  ROS  General: (-) fever, (-) chills, (-) night sweat Nose and Sinuses: (-) nasal stuffiness or itchiness, (-) postnasal drip, (-) nosebleeds, (-) sinus trouble. Mouth and Throat: (-) sore throat, (-) hoarseness. Neck: (-) swollen glands, (-) enlarged thyroid, (-) neck pain. Respiratory: - cough, - shortness of breath, - wheezing. Neurologic: - numbness, - tingling. Psychiatric: - anxiety, + depression   Current Medication: Outpatient Encounter Medications as of 04/05/2021  Medication Sig Note   B Complex Vitamins (VITAMIN-B COMPLEX PO) Take by mouth.    ergocalciferol (VITAMIN D2) 1.25 MG (50000 UT) capsule Vitamin D2 1,250 mcg (50,000 unit) capsule    lamoTRIgine (LAMICTAL) 150 MG tablet Take 1 tablet by mouth daily. 01/29/2015:  Received from: [place]:    meclizine (ANTIVERT) 12.5 MG tablet Take by mouth.    Multiple Vitamin (MULTI-VITAMINS) TABS Take by mouth.    TURMERIC PO Take by mouth.    [DISCONTINUED] ARIPiprazole (ABILIFY) 10 MG tablet Take 1 tablet (10 mg total) by mouth daily. (Patient taking differently: Take 2 mg by mouth daily.)    [DISCONTINUED] Fish Oil-Vitamin D 1000-1000 MG-UNIT CAPS Take by mouth.    No facility-administered encounter medications on file as of 04/05/2021.    Surgical History: History reviewed. No pertinent surgical history.  Medical History: Past Medical History:  Diagnosis Date   Bipolar affective ([place])    Chicken pox    IBS (irritable bowel syndrome)     Family History: Non contributory to the present illness  Social History: Social History   Socioeconomic History   Marital status: Single    Spouse name: Not on file   Number of children: Not on file   Years of education: Not on file   Highest education level: Not on file  Occupational History   Not on file  Tobacco Use   Smoking status: Never   Smokeless tobacco: Never  Vaping Use   Vaping Use: Never used  Substance and Sexual Activity   Alcohol use: No    Alcohol/week: 0.0 standard drinks   Drug use: No   Sexual activity: Yes    Partners: Male    Birth control/protection: None    Comment: 1 Partner  Other Topics Concern  Not on file  Social History Narrative   Pt is single   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 rate 16, height 4\' 11"  (1.499 m), weight 207 lb (93.9 kg), last menstrual period 10/17/2016, SpO2 96 %. Body mass index is 41.81 kg/m.     Examination: General Appearance: The patient is well-developed, well-nourished, and in no distress. Neck Circumference: 37 cm Skin: Gross inspection of skin unremarkable. Head: normocephalic, no gross deformities. Eyes: no gross deformities noted. ENT: ears appear grossly normal Neurologic: Alert and oriented. No involuntary movements.    EPWORTH SLEEPINESS SCALE:  Scale:  (0)= no chance of dozing; (1)= slight chance of dozing; (2)= moderate chance of dozing; (3)= high chance of dozing  Chance  Situtation    Sitting and reading: 2    Watching TV: 2    Sitting Inactive in public: 2    As a passenger in car: 2      Lying down to rest: 3    Sitting and talking: 1    Sitting quielty after lunch: 2    In a car, stopped in traffic: 1   TOTAL SCORE:   15 out of 24    SLEEP STUDIES:  Split 05/24/16 - AHI 63.9,  REM AHI 91.7,   Low SpO2  73%,  APAP 6-12 cmH2O   CPAP COMPLIANCE DATA:  Date Range: 04/02/2021 - 04/01/2021  Average Daily Use: 4:18 hours  Median Use: 5:05 hours  Compliance for > 4 Hours: 60%  AHI: 1.3 respiratory events per hour  Days Used: 337/365  Mask Leak: 10 lpm  95th Percentile Pressure: 17/12 cmH2O    LABS: No results found for this or any previous visit (from the past 2160 hour(s)).  Radiology: No results found.  No results found.  No results found.    Assessment and Plan: Patient Active Problem List   Diagnosis Date Noted   OSA on CPAP 05/25/2020   CPAP use counseling 05/25/2020   Morbid obesity ([place]) 05/25/2020   BMI 40.0-44.9, adult ([place]) 05/25/2020   Left wrist pain 07/02/2015   Hearing loss 02/13/2015   Encounter to establish care 01/29/2015   Dizziness 01/29/2015   1. OSA on CPAP The patient does tolerate PAP and reports  benefit from PAP use. The patient was reminded how to clean equipment and advised to replace supplies routinely. The patient was also counselled on weight loss. The compliance is poor . The  AHI is 1.3.   OSA- increase compliance with pap. Start using humidity. F/u one year.    2. CPAP use counseling CPAP Counseling: had a lengthy discussion with the patient regarding the importance of PAP therapy in management of the sleep apnea. Patient appears to understand the risk factor reduction and also understands the risks associated with untreated sleep apnea. Patient will try to make a good faith effort to remain compliant with therapy. Also instructed the patient on proper cleaning of the device including the water must be changed daily if possible and use of distilled water is preferred. Patient understands that the machine should be regularly cleaned with appropriate recommended cleaning solutions  that do not damage the PAP machine for example given white vinegar and water rinses. Other methods such as ozone treatment may not be as good as these simple methods to achieve cleaning.   3. Morbid obesity ([place]) Obesity Counseling: Had a lengthy discussion regarding patients BMI and weight issues. Patient was instructed on portion control as well as increased activity. Also discussed caloric restrictions with trying to maintain intake less than 2000 Kcal. Discussions were made in accordance with the 5As of weight management. Simple actions such as not eating late and if able to, taking a walk is suggested.     General Counseling: I have discussed the findings of the evaluation and examination with Jasleen.  I have also discussed any further diagnostic evaluation thatmay be needed or ordered today. Jochebed verbalizes understanding of the findings of todays visit. We also reviewed her medications today and discussed drug interactions and side effects including but not limited excessive drowsiness and altered mental states. We also discussed that there is always a risk not just to her but also people around her. she has been encouraged to call the office with any questions or concerns that should  arise related to todays visit.  No orders of the defined types were placed in this encounter.       I have personally obtained a history, examined the patient, evaluated laboratory and imaging results, formulated the assessment and plan and placed orders. This patient was seen today by Tressie Ellis, PA-C in collaboration with Dr. Devona Konig.   Allyne Gee, MD [facility] Diplomate ABMS Pulmonary Critical Care Medicine and Sleep Medicine

## 2021-11-02 DIAGNOSIS — I890 Lymphedema, not elsewhere classified: Secondary | ICD-10-CM | POA: Insufficient documentation

## 2022-04-04 ENCOUNTER — Ambulatory Visit (INDEPENDENT_AMBULATORY_CARE_PROVIDER_SITE_OTHER): Payer: BC Managed Care – PPO | Admitting: Internal Medicine

## 2022-04-04 VITALS — BP 117/81 | HR 87 | Resp 14 | Ht 59.0 in | Wt 202.0 lb

## 2022-04-04 DIAGNOSIS — Z7189 Other specified counseling: Secondary | ICD-10-CM

## 2022-04-04 DIAGNOSIS — G4733 Obstructive sleep apnea (adult) (pediatric): Secondary | ICD-10-CM | POA: Diagnosis not present

## 2022-04-04 NOTE — Patient Instructions (Signed)

## 2022-04-04 NOTE — Progress Notes (Signed)
[facility] [place], [place] [address]  Pulmonary Sleep Medicine   Office Visit Note  Patient Name: Cassidy Santos DOB: May 29, 1965 MRN [id number]    Chief Complaint: Obstructive Sleep Apnea visit  Brief History:  Cassidy Santos is seen today for an annual follow up on BIPAP at 17/12 cmh20. The patient has a 6 year history of sleep apnea. Patient is not using PAP nightly.  The patient feels rested after sleeping with PAP.  The patient reports not benefiting from PAP use. Reported sleepiness is not improved and the Epworth Sleepiness Score is 15 out of 24. The patient sometimes take naps, about once per week. The patient complains of the following: She wakes up coughing and takes the PAP off.  She has tried with and without humidity.  We discussed the need for humidity for dry mouth and nose.  The compliance download shows 56% compliance with an average use time of 3 hours 51 minutes. The AHI is 1.4.  The patient does not complain of limb movements disrupting sleep.  ROS  General: (-) fever, (-) chills, (-) night sweat Nose and Sinuses: (-) nasal stuffiness or itchiness, (-) postnasal drip, (-) nosebleeds, (-) sinus trouble. Mouth and Throat: (-) sore throat, (-) hoarseness. Neck: (-) swollen glands, (-) enlarged thyroid, (-) neck pain. Respiratory: - cough, - shortness of breath, - wheezing. Neurologic: - numbness, - tingling. Psychiatric: + anxiety, + depression   Current Medication: Outpatient Encounter Medications as of 04/04/2022  Medication Sig Note   B Complex Vitamins (VITAMIN-B COMPLEX PO) Take by mouth.    ergocalciferol (VITAMIN D2) 1.25 MG (50000 UT) capsule Vitamin D2 1,250 mcg (50,000 unit) capsule    lamoTRIgine (LAMICTAL) 150 MG tablet Take 1 tablet by mouth daily. 01/29/2015: Received from: [facility]:    meclizine (ANTIVERT) 12.5 MG tablet Take by mouth.    Multiple Vitamin (MULTI-VITAMINS) TABS Take by mouth.    TURMERIC PO  Take by mouth.    [DISCONTINUED] ARIPiprazole (ABILIFY) 10 MG tablet Take 1 tablet (10 mg total) by mouth daily. (Patient taking differently: Take 2 mg by mouth daily.)    No facility-administered encounter medications on file as of 04/04/2022.    Surgical History: History reviewed. No pertinent surgical history.  Medical History: Past Medical History:  Diagnosis Date   Bipolar affective ([place])    Chicken pox    IBS (irritable bowel syndrome)     Family History: Non contributory to the present illness  Social History: Social History   Socioeconomic History   Marital status: Single    Spouse name: Not on file   Number of children: Not on file   Years of education: Not on file   Highest education level: Not on file  Occupational History   Not on file  Tobacco Use   Smoking status: Never   Smokeless tobacco: Never  Vaping Use   Vaping Use: Never used  Substance and Sexual Activity   Alcohol use: No    Alcohol/week: 0.0 standard drinks of alcohol   Drug use: No   Sexual activity: Yes    Partners: Male    Birth control/protection: None    Comment: 1 Partner  Other Topics Concern   Not on file  Social History Narrative   Pt is single   Employed as a Licensed conveyancer    Has a JD degree    Social Determinants of Radio broadcast assistant Strain: Not on file  Food Insecurity: Not on file  Transportation Needs: Not on file  Physical Activity: Not on file  Stress: Not on file  Social Connections: Not on file  Intimate Partner Violence: Not on file    Vital Signs: Blood pressure 117/81, pulse 87, resp. rate 14, height 4' 11"$  (1.499 m), weight 202 lb (91.6 kg), last menstrual period 10/17/2016, SpO2 96 %. Body mass index is 40.8 kg/m.    Examination: General Appearance: The patient is well-developed, well-nourished, and in no distress. Neck Circumference: 35 cm Skin: Gross inspection of skin unremarkable. Head: normocephalic, no gross deformities. Eyes: no gross  deformities noted. ENT: ears appear grossly normal Neurologic: Alert and oriented. No involuntary movements.  STOP BANG RISK ASSESSMENT S (snore) Have you been told that you snore?     YES   T (tired) Are you often tired, fatigued, or sleepy during the day?   YES  O (obstruction) Do you stop breathing, choke, or gasp during sleep? YES   P (pressure) Do you have or are you being treated for high blood pressure? NO   B (BMI) Is your body index greater than 35 kg/m? YES   A (age) Are you 56 years old or older? YES   N (neck) Do you have a neck circumference greater than 16 inches?   YES   G (gender) Are you a female? NO   TOTAL STOP/BANG "YES" ANSWERS 6       A STOP-Bang score of 2 or less is considered low risk, and a score of 5 or more is high risk for having either moderate or severe OSA. For people who score 3 or 4, doctors may need to perform further assessment to determine how likely they are to have OSA.         EPWORTH SLEEPINESS SCALE:  Scale:  (0)= no chance of dozing; (1)= slight chance of dozing; (2)= moderate chance of dozing; (3)= high chance of dozing  Chance  Situtation    Sitting and reading: 2    Watching TV: 3    Sitting Inactive in public: 2    As a passenger in car: 2      Lying down to rest: 3    Sitting and talking: 1    Sitting quielty after lunch: 1    In a car, stopped in traffic: 1   TOTAL SCORE:   15 out of 24    SLEEP STUDIES:  SPLIT (05/24/16)  AHI 63.9, REM AHI 91.7, min SPO2 73%, APAP 6-12 cmh20    CPAP COMPLIANCE DATA:  Date Range: 03/31/21 - 03/30/22  Average Daily Use: 3 hours 51 minutes  Median Use: 5 hours 4 minutes  Compliance for > 4 Hours: 203 days  AHI: 1.4 respiratory events per hour  Days Used: 292/365  Mask Leak: 10  95th Percentile Pressure: 17/12 cmh20         LABS: No results found for this or any previous visit (from the past 2160 hour(s)).  Radiology: No results found.  No results  found.  No results found.    Assessment and Plan: Patient Active Problem List   Diagnosis Date Noted   Lymphedema of right lower extremity 11/02/2021   Uterine leiomyoma 01/26/2021   OSA on CPAP 05/25/2020   CPAP use counseling 05/25/2020   Morbid obesity ([place]) 05/25/2020   BMI 40.0-44.9, adult ([place]) 05/25/2020   Bipolar 1 disorder ([place]) 11/27/2018   Irritable bowel syndrome with constipation 03/22/2018   Prediabetes 08/08/2016   Bipolar disorder, in full remission, most  recent episode mixed ([place]) 08/05/2016   Left wrist pain 07/02/2015   Hearing loss 02/13/2015   Encounter to establish care 01/29/2015   Dizziness 01/29/2015   1. OSA on CPAP The patient struggles to  tolerate PAP and reports some benefit from PAP use. She has some gagging when she awakens.  I have recommended a full face mask. The patient was reminded how to clean equipment and advised to replace supplies routinely. The patient was also counselled on weight loss. The compliance is poor. The AHI is 1.4.   OSA on cpap- controlled. Increase compliance with pap. Mask fit for full face mask. Increase humidity. CPAP continues to be medically necessary to treat this patient's OSA. F/u one year.    2. CPAP use counseling CPAP Counseling: had a lengthy discussion with the patient regarding the importance of PAP therapy in management of the sleep apnea. Patient appears to understand the risk factor reduction and also understands the risks associated with untreated sleep apnea. Patient will try to make a good faith effort to remain compliant with therapy. Also instructed the patient on proper cleaning of the device including the water must be changed daily if possible and use of distilled water is preferred. Patient understands that the machine should be regularly cleaned with appropriate recommended cleaning solutions that do not damage the PAP machine for example given white vinegar and water rinses. Other methods such as ozone  treatment may not be as good as these simple methods to achieve cleaning.   3. Morbid obesity ([place]) Obesity Counseling: Had a lengthy discussion regarding patients BMI and weight issues. Patient was instructed on portion control as well as increased activity. Also discussed caloric restrictions with trying to maintain intake less than 2000 Kcal. Discussions were made in accordance with the 5As of weight management. Simple actions such as not eating late and if able to, taking a walk is suggested.      General Counseling: I have discussed the findings of the evaluation and examination with Moria.  I have also discussed any further diagnostic evaluation thatmay be needed or ordered today. Drena verbalizes understanding of the findings of todays visit. We also reviewed her medications today and discussed drug interactions and side effects including but not limited excessive drowsiness and altered mental states. We also discussed that there is always a risk not just to her but also people around her. she has been encouraged to call the office with any questions or concerns that should arise related to todays visit.  No orders of the defined types were placed in this encounter.       I have personally obtained a history, examined the patient, evaluated laboratory and imaging results, formulated the assessment and plan and placed orders. This patient was seen today by Tressie Ellis, PA-C in collaboration with Dr. Devona Konig.   Allyne Gee, MD [facility] Diplomate ABMS Pulmonary Critical Care Medicine and Sleep Medicine

## 2023-02-03 ENCOUNTER — Other Ambulatory Visit: Payer: Self-pay | Admitting: Obstetrics and Gynecology

## 2023-02-06 ENCOUNTER — Encounter: Payer: Self-pay | Admitting: Obstetrics and Gynecology

## 2023-02-06 ENCOUNTER — Other Ambulatory Visit: Payer: Self-pay

## 2023-02-06 ENCOUNTER — Inpatient Hospital Stay
Admission: RE | Admit: 2023-02-06 | Discharge: 2023-02-06 | Disposition: A | Discharge status: Home / Self Care | Payer: BC Managed Care – PPO | Source: Ambulatory Visit

## 2023-02-06 HISTORY — DX: Sleep apnea, unspecified: G47.30

## 2023-02-06 NOTE — Patient Instructions (Addendum)
Your procedure is scheduled on: Friday, 02/10/2023 Report to the Registration Desk on the 1st floor of the [facility]. To find out your arrival time, please call [phone] between 1PM - 3PM on: Thursday, 12/19  If your arrival time is 6:00 am, do not arrive before that time as the [facility] entrance doors do not open until 6:00 am.  REMEMBER: Instructions that are not followed completely may result in serious medical risk, up to and including death; or upon the discretion of your surgeon and anesthesiologist your surgery may need to be rescheduled.  Do not eat food after midnight the night before surgery.  No gum chewing or hard candies.  You may however, drink CLEAR liquids up to 2 hours before you are scheduled to arrive for your surgery. Do not drink anything within 2 hours of your scheduled arrival time.  Clear liquids include: - water  -gatorade    One week prior to surgery: Stop Anti-inflammatories (NSAIDS) such as Advil, Aleve, Ibuprofen, Motrin, Naproxen, Naprosyn and Aspirin based products such as Excedrin, Goody's Powder, BC Powder. Stop ANY OVER THE COUNTER supplements until after surgery.  You may however, continue to take Tylenol if needed for pain up until the day of surgery.  Stop metformin two days prior to surgery. Last dose is Dec 17.  Continue taking all of your other prescription medications up until the day of surgery.  ON THE DAY OF SURGERY ONLY TAKE THESE MEDICATIONS WITH SIPS OF WATER:  lamoTRIgine (LAMICTAL  No Alcohol for 24 hours before or after surgery.  No Smoking including e-cigarettes for 24 hours before surgery.  No chewable tobacco products for at least 6 hours before surgery.  No nicotine patches on the day of surgery.  Do not use any "recreational" drugs for at least a week (preferably 2 weeks) before your surgery.  Please be advised that the combination of cocaine and anesthesia may have negative outcomes, up to and including  death. If you test positive for cocaine, your surgery will be cancelled.  On the morning of surgery brush your teeth with toothpaste and water, you may rinse your mouth with mouthwash if you wish. Do not swallow any toothpaste or mouthwash.  You need to shower on day of surgery.  Do not wear jewelry, make-up, hairpins, clips or nail polish.  For welded (permanent) jewelry: bracelets, anklets, waist bands, etc.  Please have this removed prior to surgery.  If it is not removed, there is a chance that hospital personnel will need to cut it off on the day of surgery.  Do not wear lotions, powders, or perfumes.   Do not shave body hair from the neck down 48 hours before surgery.  Contact lenses, hearing aids and dentures may not be worn into surgery.  Do not bring valuables to the hospital. [facility] is not responsible for any missing/lost belongings or valuables.   Notify your doctor if there is any change in your medical condition (cold, fever, infection).  Wear comfortable clothing (specific to your surgery type) to the hospital.  After surgery, you can help prevent lung complications by doing breathing exercises.  Take deep breaths and cough every 1-2 hours. Your doctor may order a device called an Incentive Spirometer to help you take deep breaths.  If you are being admitted to the hospital overnight, leave your suitcase in the car. After surgery it may be brought to your room.  If you are being discharged the day of surgery, you will  not be allowed to drive home. You will need a responsible individual to drive you home and stay with you for 24 hours after surgery.    Please call the Pre-admissions Testing Dept. at [phone] if you have any questions about these instructions.  Surgery Visitation Policy:  Patients having surgery or a procedure may have two visitors.  Children under the age of 21 must have an adult with them who is not the patient.

## 2023-02-08 ENCOUNTER — Encounter: Payer: Self-pay | Admitting: Urgent Care

## 2023-02-08 ENCOUNTER — Encounter
Admission: RE | Admit: 2023-02-08 | Discharge: 2023-02-08 | Disposition: A | Discharge status: Home / Self Care | Payer: BC Managed Care – PPO | Source: Ambulatory Visit | Attending: Obstetrics and Gynecology | Admitting: Obstetrics and Gynecology

## 2023-02-08 DIAGNOSIS — Z79890 Hormone replacement therapy (postmenopausal): Secondary | ICD-10-CM | POA: Diagnosis not present

## 2023-02-08 DIAGNOSIS — R9431 Abnormal electrocardiogram [ECG] [EKG]: Secondary | ICD-10-CM | POA: Insufficient documentation

## 2023-02-08 DIAGNOSIS — E66813 Obesity, class 3: Secondary | ICD-10-CM | POA: Diagnosis not present

## 2023-02-08 DIAGNOSIS — G4730 Sleep apnea, unspecified: Secondary | ICD-10-CM | POA: Diagnosis not present

## 2023-02-08 DIAGNOSIS — Z01812 Encounter for preprocedural laboratory examination: Secondary | ICD-10-CM

## 2023-02-08 DIAGNOSIS — Z01818 Encounter for other preprocedural examination: Secondary | ICD-10-CM | POA: Insufficient documentation

## 2023-02-08 DIAGNOSIS — Z8041 Family history of malignant neoplasm of ovary: Secondary | ICD-10-CM | POA: Diagnosis not present

## 2023-02-08 DIAGNOSIS — D219 Benign neoplasm of connective and other soft tissue, unspecified: Secondary | ICD-10-CM | POA: Insufficient documentation

## 2023-02-08 DIAGNOSIS — Z7189 Other specified counseling: Secondary | ICD-10-CM | POA: Insufficient documentation

## 2023-02-08 DIAGNOSIS — N950 Postmenopausal bleeding: Secondary | ICD-10-CM | POA: Diagnosis present

## 2023-02-08 DIAGNOSIS — Z6841 Body Mass Index (BMI) 40.0 and over, adult: Secondary | ICD-10-CM | POA: Diagnosis not present

## 2023-02-08 DIAGNOSIS — F319 Bipolar disorder, unspecified: Secondary | ICD-10-CM | POA: Diagnosis not present

## 2023-02-08 LAB — TYPE AND SCREEN
ABO/RH(D): B POS
Antibody Screen: NEGATIVE

## 2023-02-08 LAB — CBC
HCT: 41.8 % (ref 36.0–46.0)
Hemoglobin: 14.5 g/dL (ref 12.0–15.0)
MCH: 30.8 pg (ref 26.0–34.0)
MCHC: 34.7 g/dL (ref 30.0–36.0)
MCV: 88.7 fL (ref 80.0–100.0)
Platelets: 343 10*3/uL (ref 150–400)
RBC: 4.71 MIL/uL (ref 3.87–5.11)
RDW: 13.9 % (ref 11.5–15.5)
WBC: 7.1 10*3/uL (ref 4.0–10.5)
nRBC: 0 % (ref 0.0–0.2)

## 2023-02-09 MED ORDER — CHLORHEXIDINE GLUCONATE 0.12 % MT SOLN
15.0000 mL | Freq: Once | OROMUCOSAL | Status: AC
  Administered 2023-02-10: 15 mL via OROMUCOSAL

## 2023-02-09 MED ORDER — ORAL CARE MOUTH RINSE: 15.0000 mL | Freq: Once | OROMUCOSAL | Status: AC

## 2023-02-09 MED ORDER — LACTATED RINGERS IV SOLN: INTRAVENOUS | Status: DC

## 2023-02-10 ENCOUNTER — Ambulatory Visit: Payer: BC Managed Care – PPO | Admitting: Anesthesiology

## 2023-02-10 ENCOUNTER — Other Ambulatory Visit: Payer: Self-pay

## 2023-02-10 ENCOUNTER — Ambulatory Visit
Admission: RE | Admit: 2023-02-10 | Discharge: 2023-02-10 | Disposition: A | Discharge status: Home / Self Care | Payer: BC Managed Care – PPO | Attending: Obstetrics and Gynecology | Admitting: Obstetrics and Gynecology

## 2023-02-10 ENCOUNTER — Encounter: Payer: Self-pay | Admitting: Obstetrics and Gynecology

## 2023-02-10 ENCOUNTER — Encounter
Admission: RE | Disposition: A | Discharge status: Home / Self Care | Payer: Self-pay | Source: Home / Self Care | Attending: Obstetrics and Gynecology

## 2023-02-10 DIAGNOSIS — Z79890 Hormone replacement therapy (postmenopausal): Secondary | ICD-10-CM | POA: Insufficient documentation

## 2023-02-10 DIAGNOSIS — G4730 Sleep apnea, unspecified: Secondary | ICD-10-CM | POA: Insufficient documentation

## 2023-02-10 DIAGNOSIS — Z8041 Family history of malignant neoplasm of ovary: Secondary | ICD-10-CM | POA: Insufficient documentation

## 2023-02-10 DIAGNOSIS — F319 Bipolar disorder, unspecified: Secondary | ICD-10-CM | POA: Insufficient documentation

## 2023-02-10 DIAGNOSIS — E66813 Obesity, class 3: Secondary | ICD-10-CM | POA: Insufficient documentation

## 2023-02-10 DIAGNOSIS — N950 Postmenopausal bleeding: Secondary | ICD-10-CM | POA: Diagnosis not present

## 2023-02-10 DIAGNOSIS — Z6841 Body Mass Index (BMI) 40.0 and over, adult: Secondary | ICD-10-CM | POA: Insufficient documentation

## 2023-02-10 DIAGNOSIS — Z01812 Encounter for preprocedural laboratory examination: Secondary | ICD-10-CM

## 2023-02-10 DIAGNOSIS — Z7189 Other specified counseling: Secondary | ICD-10-CM

## 2023-02-10 DIAGNOSIS — D219 Benign neoplasm of connective and other soft tissue, unspecified: Secondary | ICD-10-CM

## 2023-02-10 HISTORY — PX: DILATATION & CURETTAGE/HYSTEROSCOPY WITH MYOSURE: SHX6511

## 2023-02-10 HISTORY — PX: INTRAUTERINE DEVICE (IUD) INSERTION: SHX5877

## 2023-02-10 HISTORY — DX: Benign neoplasm of connective and other soft tissue, unspecified: D21.9

## 2023-02-10 HISTORY — DX: Postmenopausal bleeding: N95.0

## 2023-02-10 LAB — ABO/RH: ABO/RH(D): B POS

## 2023-02-10 SURGERY — DILATATION & CURETTAGE/HYSTEROSCOPY WITH MYOSURE
Anesthesia: General

## 2023-02-10 MED ORDER — LIDOCAINE HCL (CARDIAC) PF 100 MG/5ML IV SOSY
PREFILLED_SYRINGE | INTRAVENOUS | Status: DC | PRN
  Administered 2023-02-10: 60 mg via INTRAVENOUS

## 2023-02-10 MED ORDER — OXYCODONE HCL 5 MG/5ML PO SOLN: 5.0000 mg | Freq: Once | ORAL | Status: AC | PRN

## 2023-02-10 MED ORDER — PROPOFOL 1000 MG/100ML IV EMUL
INTRAVENOUS | Status: AC
  Filled 2023-02-10: qty 100

## 2023-02-10 MED ORDER — PROPOFOL 10 MG/ML IV BOLUS
INTRAVENOUS | Status: DC | PRN
  Administered 2023-02-10: 180 mg via INTRAVENOUS

## 2023-02-10 MED ORDER — LEVONORGESTREL 20 MCG/DAY IU IUD
INTRAUTERINE_SYSTEM | INTRAUTERINE | Status: AC
  Filled 2023-02-10: qty 1

## 2023-02-10 MED ORDER — PROPOFOL 10 MG/ML IV BOLUS
INTRAVENOUS | Status: AC
  Filled 2023-02-10: qty 20

## 2023-02-10 MED ORDER — DROPERIDOL 2.5 MG/ML IJ SOLN: 0.6250 mg | Freq: Once | INTRAMUSCULAR | Status: DC | PRN

## 2023-02-10 MED ORDER — CHLORHEXIDINE GLUCONATE 0.12 % MT SOLN
OROMUCOSAL | Status: AC
  Filled 2023-02-10: qty 15

## 2023-02-10 MED ORDER — FENTANYL CITRATE (PF) 100 MCG/2ML IJ SOLN
INTRAMUSCULAR | Status: DC | PRN
  Administered 2023-02-10 (×2): 50 ug via INTRAVENOUS

## 2023-02-10 MED ORDER — 0.9 % SODIUM CHLORIDE (POUR BTL) OPTIME
TOPICAL | Status: DC | PRN
  Administered 2023-02-10: 500 mL

## 2023-02-10 MED ORDER — ACETAMINOPHEN 500 MG PO TABS
ORAL_TABLET | ORAL | Status: AC
  Filled 2023-02-10: qty 2

## 2023-02-10 MED ORDER — OXYCODONE HCL 5 MG PO TABS
ORAL_TABLET | ORAL | Status: AC
  Filled 2023-02-10: qty 1

## 2023-02-10 MED ORDER — ONDANSETRON HCL 4 MG/2ML IJ SOLN
INTRAMUSCULAR | Status: DC | PRN
  Administered 2023-02-10: 4 mg via INTRAVENOUS

## 2023-02-10 MED ORDER — MIDAZOLAM HCL 2 MG/2ML IJ SOLN
INTRAMUSCULAR | Status: AC
  Filled 2023-02-10: qty 2

## 2023-02-10 MED ORDER — OXYCODONE HCL 5 MG PO TABS
5.0000 mg | ORAL_TABLET | Freq: Once | ORAL | Status: AC | PRN
  Administered 2023-02-10: 5 mg via ORAL

## 2023-02-10 MED ORDER — ACETAMINOPHEN 10 MG/ML IV SOLN: 1000.0000 mg | Freq: Once | INTRAVENOUS | Status: DC | PRN

## 2023-02-10 MED ORDER — FENTANYL CITRATE (PF) 100 MCG/2ML IJ SOLN
INTRAMUSCULAR | Status: AC
  Filled 2023-02-10: qty 2

## 2023-02-10 MED ORDER — LEVONORGESTREL 20 MCG/DAY IU IUD
1.0000 | INTRAUTERINE_SYSTEM | Freq: Once | INTRAUTERINE | Status: DC
  Filled 2023-02-10: qty 1

## 2023-02-10 MED ORDER — SILVER NITRATE-POT NITRATE 75-25 % EX MISC
CUTANEOUS | Status: DC | PRN
  Administered 2023-02-10: 1 via TOPICAL

## 2023-02-10 MED ORDER — PENTAFLUOROPROP-TETRAFLUOROETH EX AERO
INHALATION_SPRAY | CUTANEOUS | Status: AC
  Filled 2023-02-10: qty 30

## 2023-02-10 MED ORDER — ACETAMINOPHEN 500 MG PO TABS
1000.0000 mg | ORAL_TABLET | Freq: Once | ORAL | Status: AC
  Administered 2023-02-10: 1000 mg via ORAL

## 2023-02-10 MED ORDER — LACTATED RINGERS IV SOLN: INTRAVENOUS | Status: DC | PRN

## 2023-02-10 MED ORDER — FENTANYL CITRATE (PF) 100 MCG/2ML IJ SOLN: 25.0000 ug | INTRAMUSCULAR | Status: DC | PRN

## 2023-02-10 MED ORDER — SILVER NITRATE-POT NITRATE 75-25 % EX MISC
CUTANEOUS | Status: AC
  Filled 2023-02-10: qty 10

## 2023-02-10 MED ORDER — MIDAZOLAM HCL 2 MG/2ML IJ SOLN
INTRAMUSCULAR | Status: DC | PRN
  Administered 2023-02-10 (×2): 1 mg via INTRAVENOUS

## 2023-02-10 MED ORDER — DEXAMETHASONE SODIUM PHOSPHATE 10 MG/ML IJ SOLN
INTRAMUSCULAR | Status: DC | PRN
  Administered 2023-02-10: 10 mg via INTRAVENOUS

## 2023-02-10 SURGICAL SUPPLY — 20 items
DEVICE MYOSURE LITE (MISCELLANEOUS) IMPLANT
DRSG TELFA 3X8 NADH STRL (GAUZE/BANDAGES/DRESSINGS) IMPLANT
GLOVE BIO SURGEON STRL SZ7 (GLOVE) ×1 IMPLANT
GLOVE INDICATOR 7.5 STRL GRN (GLOVE) ×1 IMPLANT
GOWN STRL REUS W/ TWL LRG LVL3 (GOWN DISPOSABLE) ×2 IMPLANT
KIT PROCEDURE FLUENT (KITS) IMPLANT
KIT TURNOVER CYSTO (KITS) ×1 IMPLANT
MANIFOLD NEPTUNE II (INSTRUMENTS) ×1 IMPLANT
PACK DNC HYST (MISCELLANEOUS) ×1 IMPLANT
PAD OB MATERNITY 4.3X12.25 (PERSONAL CARE ITEMS) ×1 IMPLANT
PAD PREP OB/GYN DISP 24X41 (PERSONAL CARE ITEMS) ×1 IMPLANT
SCRUB CHG 4% DYNA-HEX 4OZ (MISCELLANEOUS) ×1 IMPLANT
SET CYSTO W/LG BORE CLAMP LF (SET/KITS/TRAYS/PACK) IMPLANT
SOL PREP PVP 2OZ (MISCELLANEOUS) ×1
SOLUTION PREP PVP 2OZ (MISCELLANEOUS) ×1 IMPLANT
SYS MIRENA INTRAUTERINE (MISCELLANEOUS) ×1
SYSTEM MIRENA INTRAUTERINE (MISCELLANEOUS) IMPLANT
TOWEL OR 17X26 4PK STRL BLUE (TOWEL DISPOSABLE) ×1 IMPLANT
TRAP FLUID SMOKE EVACUATOR (MISCELLANEOUS) ×1 IMPLANT
WATER STERILE IRR 500ML POUR (IV SOLUTION) ×1 IMPLANT

## 2023-02-10 NOTE — Transfer of Care (Signed)
Immediate Anesthesia Transfer of Care Note  Patient: Cassidy Santos  Procedure(s) Performed: DILATATION & CURETTAGE/HYSTEROSCOPY WITH MYOSURE INTRAUTERINE DEVICE (IUD) INSERTION MIRENA  Patient Location: PACU  Anesthesia Type:General  Level of Consciousness: awake, alert , and oriented  Airway & Oxygen Therapy: Patient Spontanous Breathing  Post-op Assessment: Report given to RN and Post -op Vital signs reviewed and stable  Post vital signs: Reviewed and stable  Last Vitals:  Vitals Value Taken Time  BP 122/73 02/10/23 1550  Temp    Pulse 129 02/10/23 1550  Resp 18 02/10/23 1554  SpO2 57 % 02/10/23 1550  Vitals shown include unfiled device data.  Last Pain:  Vitals:   02/10/23 1416  TempSrc: Temporal  PainSc: 0-No pain         Complications: No notable events documented.

## 2023-02-10 NOTE — Op Note (Addendum)
Operative Report Hysteroscopy with Dilation and Curettage   Indications: Postmenopausal bleeding  Pre-operative Diagnosis: Thickened endometrial stripe  Post-operative Diagnosis: same.  Procedure: 1. Exam under anesthesia 2. Fractional D&C 3. Hysteroscopy 4. Myosure polypectomy 5. IUD placement, with therapeutic progesterone  Surgeon: Christeen Douglas, MD  Assistant(s):  None  Anesthesia: General LMA anesthesia  Anesthesiologist: Foye Deer, MD Anesthesiologist: Foye Deer, MD CRNA: Reece Agar, CRNA  Estimated Blood Loss:   40  Total IV Fluids:  Urine Output:  Total Fluid Deficit:  660 mL          Specimens: Endocervical curettings, endometrial curettings         Complications:  None; patient tolerated the procedure well.         Disposition: PACU - hemodynamically stable.         Condition: stable  Findings: Uterus measuring 8 cm by sound; normal cervix, vagina, perineum.  Unusual cavity with significant proliferative tissue bleeding heavily.  However, I was unable to see either of the fallopian tube ostia.  After a blind D&C, and reentrance into the cavity, the surrounding areas were injected and hyperemic, but I was still unable to see the fallopian tubes.  The cavity looked like a different cavity, unusually smooth for what I would expect after my first visualization.  However, there is no evidence of perforation and no evidence on direct visualization of the 2 separate cervices, endocervical canals, or intrauterine spaces.  Indication for procedure/Consents: 57 y.o. F here for scheduled surgery for the aforementioned diagnoses.    Risks of surgery were discussed with the patient including but not limited to: bleeding which may require transfusion; infection which may require antibiotics; injury to uterus or surrounding organs; intrauterine scarring which may impair future fertility; need for additional procedures including  laparotomy or laparoscopy; and other postoperative/anesthesia complications. Written informed consent was obtained.    Procedure Details:   D&C/ Myosure  The patient was taken to the operating room where anesthesia was administered and was found to be adequate.  After a formal and adequate timeout was performed, she was placed in the dorsal lithotomy position and examined with the above findings. She was then prepped and draped in the sterile manner.   Her bladder was catheterized for an estimated amount of clear, yellow urine. A weighed speculum was then placed in the patient's vagina and a single tooth tenaculum was applied to the anterior lip of the cervix.  An endocervical curette was performed. Her cervix was serially dilated to 15 [place] using Hanks dilators. The hysteroscope was introduced under direct observation  Using lactated ringers as a distention medium to reveal the above findings. The uterine cavity was carefully examined, both ostia were unable to be recognized, and diffusely proliferative and bleeding endometrium as above were noted.   These areas were resected using the Myosure device.  After further careful visualization of the uterine cavity, the hysteroscope was removed under direct visualization.  A sharp curettage was then performed until there was a gritty texture in all four quadrants.  Reintroduction of the hysteroscope revealed a smooth bloody cavity, without the proliferative tissue, but with significantly less roughed up edges than I would have expected.    A Mirena IUD was placed in the fundus of the cavity, and the strings cut to 5 cm.  The tenaculum was removed from the anterior lip of the cervix and the vaginal speculum was removed after applying silver nitrate/pressure for good hemostasis.  The patient tolerated the procedure well and was taken to the recovery area awake and in stable condition. She received iv acetaminophen and Toradol prior to leaving the OR.  The  patient will be discharged to home as per PACU criteria. Routine postoperative instructions given.  She was prescribed Ibuprofen and Colace.  She will follow up in the clinic in two weeks for postoperative evaluation.

## 2023-02-10 NOTE — H&P (Signed)
Cassidy Santos is a 57 y.o. female G0P0000 here for Follow-up (Follow up D&C consult - Has further questions in regards to scheduling a D&C and if this is the right option (thickened endometrium))   Referring provider: Christy Gentles*   History of Present Illness: Patient returns today to discuss her thickened endometrial stripe, EMBx and next steps.    Patient had an ultrasound in 07/2022 for strong Fhx of ovarian cancer (in her half sister in late 64's) which showed endometrium thickened to 5 mm. A repeat ultrasound was done in 11/2022 which showed endometrium thickened to 11.4 mm. Her provider attempted EMBx but  was unable to get diagnostic tissue d/t cervical stenosis and a D&C was recommended to the patient.    She was seen by me for second opinion in 11/2022. We discussed many options for management and patient chose f/u in 4 months with repeat ultrasound and plan for D&C if endometrium still thickened or increased in thickness.    Today: She has questions today about the D&C.    Since her last visit in 11/2022 - I increased her Prometrium to 200 mg  - She had prednisone for a week and then when she stopped her prednisone she had PMB: 11/17-11/25 & 12/2-12/9   TVUS 11/2022 Uterus 5.05 x 3.6 x 4.61 cm  Endometrium 11.4  Bilateral ovaries wnl    The endometrium is thick and vesicular with vesicles measuring up to 5 mm in size. Color flow assay shows prominent flow within the endometrial matrix.     TVUS 07/2022 Uterus 6.7 x 3.5 x 4.6 cm  Endometrium: 5 mm  Fibroid #1: Anterior right, 1.6 cm.  Fibroid #2: Posterior mid, 1.4 1.7 cm.  Bilateral ovaries wnl     Pertinent Hx: - Last pap 12/2021 neg/neg - HRT: Climara 0.05 mg patch and prometrium 100 mg po nightly since 09/2022 with good improvement of sx              - she stopped her HRT after ES increased    - Fhx ovarian cancer in her half sister - in late 76's - she is unsure if she had genetic tested.     Past  Medical History:  has a past medical history of Anxiety (2000), Bipolar 1 disorder (CMS/HHS-HCC), Bipolar disorder (CMS/HHS-HCC), Depression (1990), Fibroid (2010), Hyperglycemia, Hyperglycemia, unspecified, IBS (irritable bowel syndrome), IBS (irritable bowel syndrome), Psychiatric illness, Psychological trauma (2024), and Sleep apnea.  Past Surgical History:  has a past surgical history that includes psychiatric and Colonoscopy. Family History: family history includes Breast cancer in her maternal aunt; Cancer in her sister; Coronary Artery Disease (Blocked arteries around heart) in her father; Depression in her mother; Heart disease in her father; High blood pressure (Hypertension) in her mother; No Known Problems in her brother, maternal grandfather, maternal grandmother, paternal grandfather, and paternal grandmother; Obesity in her mother and sister; Ovarian cancer in an other family member. Social History:  reports that she has never smoked. She has never used smokeless tobacco. She reports that she does not drink alcohol and does not use drugs. OB/GYN History:  OB History       Gravida  0   Para  0   Term  0   Preterm  0   AB  0   Living  0        SAB  0   IAB  0   Ectopic  0   Molar  0   Multiple  0   Live Births  0           Allergies: has No Known Allergies. Medications: Current Medications  Current Outpatient Medications:    albuterol MDI, PROVENTIL, VENTOLIN, PROAIR, HFA 90 mcg/actuation inhaler, Inhale 2 inhalations into the lungs every 6 (six) hours as needed, Disp: 18 g, Rfl: 0   estradioL (CLIMARA) 0.05 mg/24 hr patch, Place 1 patch onto the skin once a week, Disp: 4 patch, Rfl: 11   fluticasone propionate (FLONASE) 50 mcg/actuation nasal spray, shake liquid and use 1 spray in each nostril twice daily, Disp: 16 g, Rfl: 2   ibuprofen (MOTRIN) 800 MG tablet, , Disp: , Rfl:    ipratropium (ATROVENT) 21 mcg (0.03 %) nasal spray, Place 2 sprays into both  nostrils 2 (two) times daily, Disp: 30 mL, Rfl: 11   Lactobacillus acidophilus (PROBIOTIC ORAL), Take by mouth, Disp: , Rfl:    MAGNESIUM ORAL, Take by mouth, Disp: , Rfl:    meclizine (ANTIVERT) 12.5 mg tablet, Take 1 tablet (12.5 mg total) by mouth 3 (three) times daily as needed for Dizziness., Disp: 30 tablet, Rfl: 0   metFORMIN (GLUCOPHAGE-XR) 500 MG XR tablet, Take 1 tablet (500 mg total) by mouth daily with dinner, Disp: 30 tablet, Rfl: 11   methylPREDNISolone (MEDROL DOSEPACK) 4 mg tablet, FOLLOW PACKAGE DIRECTIONS FOR INFLAMMATION .Marland KitchenSTART TAKING THE DAY BEFORE TREATMENT, Disp: , Rfl:    multivitamin tablet, Take by mouth once daily, Disp: , Rfl:    omega3/dha/epa/fish oil/vit D3 (OM-3-DHA-EPA-FISH OIL-VIT D3) 120 mg-180 mg -1,000 unit Cap, Take by mouth, Disp: , Rfl:    progesterone (PROMETRIUM) 200 MG capsule, Take 1 capsule (200 mg total) by mouth at bedtime, Disp: 90 capsule, Rfl: 2   [START ON 02/02/2023] estradiol (DOTTI) patch 0.05 mg/24 hr, Place 1 patch onto the skin twice a week, Disp: 8 patch, Rfl: 11   lamoTRIgine (LAMICTAL) 150 MG tablet, Take 1 tablet (150 mg total) by mouth once daily, Disp: 30 tablet, Rfl: 11   progesterone (PROMETRIUM) 100 MG capsule, Take 1 capsule (100 mg total) by mouth once daily (Patient not taking: Reported on 01/31/2023), Disp: 30 capsule, Rfl: 11   TURMERIC ROOT EXTRACT ORAL, Take by mouth (Patient not taking: Reported on 01/31/2023), Disp: , Rfl:    VITAMIN B COMPLEX ORAL, Take by mouth (Patient not taking: Reported on 01/31/2023), Disp: , Rfl:      Review of Systems: No SOB, no palpitations or chest pain, no new lower extremity edema, no nausea or vomiting or bowel or bladder complaints. See HPI for gyn specific ROS.    Exam:    BP 132/70   Ht 149.9 cm (4\' 11" )   Wt 92 kg (202 lb 12.8 oz)   LMP 02/28/2017   BMI 40.96 kg/m     Constitutional:  General appearance: Well nourished, well developed female in no acute distress.  CV: RRR Pulm:  CTAB Neuro/psych:  Normal mood and affect. No gross motor deficits. Neck:  Supple, normal appearance.  Respiratory:  Normal respiratory effort, no use of accessory muscles Skin:  No visible rashes or external lesions    Impression:    The primary encounter diagnosis was Thickened endometrium. A diagnosis of Post-menopausal bleeding was also pertinent to this visit.   Plan:    - Incidentally found thickened endometrium, PMB:  - No PMB. Her ES increased from 5 mm to 11.4 mm in 4 months. Her endometrial biopsy was unsuccessful showing no diagnostic tissues.  -  We had discussed at her last visit these results and options for next steps: Saline ultrasound, EMBx, repeat ultrasound in 2-4 months, D&C with hysteroscopy in the OR with or without placement of Mirena IUD.    - Since her last visit, she has newly began to experience PMB. Because of there thickened endometrial stripe and the PMB, I did medically recommend she move forward with a D&C with insertion of the IUD.     - HRT management: Patch trouble staying in place, instead of once weekly estrogen patch, will start twice weekly  Sent twice weekly 0.05 mg patch   Continue progesterone po 200mg  nightly

## 2023-02-10 NOTE — Discharge Instructions (Signed)
Discharge instructions after a hysteroscopy with dilation and curettage ? ?Signs and Symptoms to Report ? ?Call our office at [phone] if you have any of the following:  ? ? Fever over 100.4 degrees or higher ? Severe stomach pain not relieved with pain medications ? Bright red bleeding that?s heavier than a period that does not slow with rest after the first 24 hours ? To go the bathroom a lot (frequency), you can?t hold your urine (urgency), or it hurts when you empty your bladder (urinate) ? Chest pain ? Shortness of breath ? Pain in the calves of your legs ? Severe nausea and vomiting not relieved with anti-nausea medications ? Any concerns ? ?What You Can Expect after Surgery ? You may see some pink tinged, bloody fluid. This is normal. You may also have cramping for several days.  ? ?Activities after Your Discharge ?Follow these guidelines to help speed your recovery at home: ? Don?t drive if you are in pain or taking narcotic pain medicine. You may drive when you can safely slam on the brakes, turn the wheel forcefully, and rotate your torso comfortably. This is typically 4-7 days. Practice in a parking lot or side street prior to attempting to drive regularly.  ? Ask others to help with household chores for 4 weeks. ? Don?t do strenuous activities, exercises, or sports like vacuuming, tennis, squash, etc. until your doctor says it is safe to do so. ? Walk as you feel able. Rest often since it may take a week or two for your energy level to return to normal.  ? You may climb stairs ? Avoid constipation: ?  -Eat fruits, vegetables, and whole grains. Eat small meals as your appetite will take time to return to normal. ?  -Drink 6 to 8 glasses of water each day unless your doctor has told you to limit your fluids. ?  -Use a laxative or stool softener as needed if constipation becomes a problem. You may take Miralax, metamucil, Citrucil, Colace, Senekot, FiberCon, etc. If this does not relieve the  constipation, try two tablespoons of Milk Of Magnesia every 8 hours until your bowels move.  ? You may shower.  ? Do not get in a hot tub, swimming pool, etc. until your doctor agrees. ? Do not douche, use tampons, or have sex until your doctor says it is okay, usually about 2 weeks. ? Take your pain medicine when you need it. The medicine may not work as well if the pain is bad. ? ?Take the medicines you were taking before surgery. Other medications you might need are pain medications (ibuprofen), medications for constipation (Colace) and nausea medications (Zofran).  ? ?  ?

## 2023-02-10 NOTE — Anesthesia Preprocedure Evaluation (Addendum)
Anesthesia Evaluation  Patient identified by MRN, date of birth, ID band Patient awake    Reviewed: Allergy & Precautions, H&P , NPO status , Patient's Chart, lab work & pertinent test results  Airway Mallampati: II  TM Distance: >3 FB Neck ROM: full    Dental no notable dental hx.    Pulmonary sleep apnea    Pulmonary exam normal        Cardiovascular negative cardio ROS Normal cardiovascular exam     Neuro/Psych  PSYCHIATRIC DISORDERS   Bipolar Disorder   negative neurological ROS     GI/Hepatic negative GI ROS, Neg liver ROS,,,  Endo/Other    Class 3 obesity  Renal/GU      Musculoskeletal   Abdominal  (+) + obese  Peds  Hematology negative hematology ROS (+)   Anesthesia Other Findings Past Medical History: No date: Bipolar affective (HCC) No date: Chicken pox No date: Fibroids No date: IBS (irritable bowel syndrome) No date: PMB (postmenopausal bleeding) No date: Sleep apnea  Past Surgical History: No date: root canal; Bilateral     Reproductive/Obstetrics negative OB ROS                             Anesthesia Physical Anesthesia Plan  ASA: 3  Anesthesia Plan: General LMA   Post-op Pain Management: Tylenol PO (pre-op)* and Toradol IV (intra-op)*   Induction:   PONV Risk Score and Plan: Dexamethasone, Ondansetron, Midazolam and Treatment may vary due to age or medical condition  Airway Management Planned: LMA  Additional Equipment:   Intra-op Plan:   Post-operative Plan: Extubation in OR  Informed Consent: I have reviewed the patients History and Physical, chart, labs and discussed the procedure including the risks, benefits and alternatives for the proposed anesthesia with the patient or authorized representative who has indicated his/her understanding and acceptance.     Dental Advisory Given  Plan Discussed with: Anesthesiologist, CRNA and  Surgeon  Anesthesia Plan Comments:         Anesthesia Quick Evaluation

## 2023-02-10 NOTE — Anesthesia Procedure Notes (Signed)
Procedure Name: LMA Insertion Date/Time: 02/10/2023 3:03 PM  Performed by: Reece Agar, CRNAPre-anesthesia Checklist: Patient identified, Emergency Drugs available, Suction available and Patient being monitored Patient Re-evaluated:Patient Re-evaluated prior to induction Oxygen Delivery Method: Circle system utilized Preoxygenation: Pre-oxygenation with 100% oxygen Induction Type: IV induction Ventilation: Mask ventilation without difficulty LMA: LMA inserted LMA Size: 4.0 Number of attempts: 1 Placement Confirmation: positive ETCO2 and breath sounds checked- equal and bilateral Tube secured with: Tape Dental Injury: Teeth and Oropharynx as per pre-operative assessment

## 2023-02-13 ENCOUNTER — Encounter: Payer: Self-pay | Admitting: Obstetrics and Gynecology

## 2023-02-13 LAB — SURGICAL PATHOLOGY

## 2023-02-13 NOTE — Anesthesia Postprocedure Evaluation (Signed)
Anesthesia Post Note  Patient: Garment/textile technologist  Procedure(s) Performed: DILATATION & CURETTAGE/HYSTEROSCOPY WITH MYOSURE INTRAUTERINE DEVICE (IUD) INSERTION MIRENA  Patient location during evaluation: PACU Anesthesia Type: General Level of consciousness: awake and alert Pain management: pain level controlled Vital Signs Assessment: post-procedure vital signs reviewed and stable Respiratory status: spontaneous breathing, nonlabored ventilation and respiratory function stable Cardiovascular status: blood pressure returned to baseline and stable Postop Assessment: no apparent nausea or vomiting Anesthetic complications: no   No notable events documented.   Last Vitals:  Vitals:   02/10/23 1615 02/10/23 1637  BP: 135/85 (!) 140/90  Pulse:  82  Resp: 13 16  Temp:  (!) 36.1 C  SpO2: 100% 100%    Last Pain:  Vitals:   02/12/23 1521  TempSrc:   PainSc: 0-No pain                 Foye Deer
